# Patient Record
Sex: Female | Born: 1980
Health system: Southern US, Community
[De-identification: ages and names within clinical notes are randomized; demographics above are authoritative.]

## PROBLEM LIST (undated history)

## (undated) DIAGNOSIS — G43909 Migraine, unspecified, not intractable, without status migrainosus: Secondary | ICD-10-CM

## (undated) DIAGNOSIS — J45909 Unspecified asthma, uncomplicated: Secondary | ICD-10-CM

## (undated) DIAGNOSIS — L509 Urticaria, unspecified: Secondary | ICD-10-CM

## (undated) DIAGNOSIS — N979 Female infertility, unspecified: Secondary | ICD-10-CM

## (undated) DIAGNOSIS — L309 Dermatitis, unspecified: Secondary | ICD-10-CM

## (undated) HISTORY — DX: Migraine, unspecified, not intractable, without status migrainosus: G43.909

## (undated) HISTORY — DX: Unspecified asthma, uncomplicated: J45.909

## (undated) HISTORY — DX: Female infertility, unspecified: N97.9

## (undated) HISTORY — DX: Dermatitis, unspecified: L30.9

## (undated) HISTORY — DX: Urticaria, unspecified: L50.9

---

## 2018-11-13 DIAGNOSIS — L570 Actinic keratosis: Secondary | ICD-10-CM | POA: Diagnosis not present

## 2018-11-13 DIAGNOSIS — D2239 Melanocytic nevi of other parts of face: Secondary | ICD-10-CM | POA: Diagnosis not present

## 2019-05-24 ENCOUNTER — Other Ambulatory Visit: Payer: Self-pay

## 2019-05-25 ENCOUNTER — Encounter: Payer: Self-pay | Admitting: Family Medicine

## 2019-05-25 ENCOUNTER — Ambulatory Visit (INDEPENDENT_AMBULATORY_CARE_PROVIDER_SITE_OTHER): Payer: 59 | Admitting: Family Medicine

## 2019-05-25 VITALS — BP 102/68 | HR 78 | Temp 97.1°F | Ht 65.0 in | Wt 128.0 lb

## 2019-05-25 DIAGNOSIS — J4540 Moderate persistent asthma, uncomplicated: Secondary | ICD-10-CM

## 2019-05-25 DIAGNOSIS — J4530 Mild persistent asthma, uncomplicated: Secondary | ICD-10-CM | POA: Insufficient documentation

## 2019-05-25 DIAGNOSIS — Z0000 Encounter for general adult medical examination without abnormal findings: Secondary | ICD-10-CM

## 2019-05-25 MED ORDER — ALBUTEROL SULFATE HFA 108 (90 BASE) MCG/ACT IN AERS: 1.0000 | INHALATION_SPRAY | Freq: Four times a day (QID) | RESPIRATORY_TRACT | 1 refills | Status: DC | PRN

## 2019-05-25 NOTE — Progress Notes (Addendum)
Established Patient Office Visit  Subjective:  Patient ID: Holly Lane, female    DOB: 14-Oct-1980  Age: 39 y.o. MRN: [id number]  CC:  Chief Complaint  Patient presents with  . Establish Care    New pt, asthma evaluation need refills.     HPI Holly Lane presents for establishment of care and follow-up of her asthma.  Longstanding history of asthma since she was 39 years old.  She is a dedicated distance runner.  She is training most days of the week.  She uses her albuterol inhaler before each run.  She has been tried on some various controllers about 10 years ago but did not tolerate them well.  She is also has a history of allergy rhinitis worse in the spring.  She has not tolerated Zyrtec due to sedation.  She has taken Singulair and denies personality changes with it.  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  She is married.  Has not been able to access dental care this year.  Requests referral for GYN care.  History of periodic swelling in her left lower leg.  She has been diagnosed with incompetent venous valves.  Seems to be worse in the humidity.  Exercise seems to help it.  She occasionally wears support stockings.  Past Medical History:  Diagnosis Date  . Asthma   . Eczema   . Infertility, female   . Migraine   . Urticaria     History reviewed. No pertinent surgical history.  Family History  Problem Relation Age of Onset  . Healthy Mother   . Allergic rhinitis Mother   . Eczema Mother   . Healthy Father   . Allergic rhinitis Father   . Migraines Sister   . Asthma Brother   . Angioedema Neg Hx   . Immunodeficiency Neg Hx   . Urticaria Neg Hx     Social History   Socioeconomic History  . Marital status: Married    Spouse name: Dorothea Ogle  . Number of children: 0  . Years of education: Masters  . Highest education level: Master's degree (e.g., MA, MS, MEng, MEd, MSW, MBA)  Occupational History  .  Not on file  Tobacco Use  . Smoking status: Never Smoker  . Smokeless tobacco: Never Used  Vaping Use  . Vaping Use: Never used  Substance and Sexual Activity  . Alcohol use: Yes    Comment: social  . Drug use: Never  . Sexual activity: Yes  Other Topics Concern  . Not on file  Social History Narrative  . Not on file   Social Determinants of Health   Financial Resource Strain:   . Difficulty of Paying Living Expenses:   Food Insecurity:   . Worried About Charity fundraiser in the Last Year:   . Arboriculturist in the Last Year:   Transportation Needs:   . Film/video editor (Medical):   Marland Kitchen Lack of Transportation (Non-Medical):   Physical Activity:   . Days of Exercise per Week:   . Minutes of Exercise per Session:   Stress:   . Feeling of Stress :   Social Connections:   . Frequency of Communication with Friends and Family:   . Frequency of Social Gatherings with Friends and Family:   . Attends Religious Services:   . Active Member of Clubs or Organizations:   . Attends Archivist Meetings:   .  Marital Status:   Intimate Partner Violence:   . Fear of Current or Ex-Partner:   . Emotionally Abused:   Marland Kitchen Physically Abused:   . Sexually Abused:     Outpatient Medications Prior to Visit  Medication Sig Dispense Refill  . albuterol (VENTOLIN HFA) 108 (90 Base) MCG/ACT inhaler Inhale 1 puff into the lungs daily.    . montelukast (SINGULAIR) 10 MG tablet Take 10 mg by mouth daily.     No facility-administered medications prior to visit.    Allergies  Allergen Reactions  . Penicillins     ROS Review of Systems  Constitutional: Negative.   HENT: Negative.   Eyes: Negative.   Respiratory: Negative for chest tightness, shortness of breath and wheezing.   Cardiovascular: Positive for leg swelling. Negative for chest pain and palpitations.  Gastrointestinal: Negative.   Endocrine: Negative for polyphagia and polyuria.  Genitourinary: Negative.     Musculoskeletal: Negative.   Allergic/Immunologic: Negative for immunocompromised state.  Neurological: Negative.   Hematological: Does not bruise/bleed easily.  Psychiatric/Behavioral: Negative for dysphoric mood. The patient is not nervous/anxious.       Objective:    Physical Exam  Constitutional: She is oriented to person, place, and time. She appears well-developed and well-nourished. No distress.  HENT:  Head: Normocephalic and atraumatic.  Right Ear: External ear normal.  Left Ear: External ear normal.  Eyes: Conjunctivae are normal. Right eye exhibits no discharge. Left eye exhibits no discharge. No scleral icterus.  Neck: No JVD present. No tracheal deviation present.  Cardiovascular: Normal rate, regular rhythm and normal heart sounds.  Pulmonary/Chest: Effort normal and breath sounds normal. No stridor.  Neurological: She is alert and oriented to person, place, and time.  Skin: Skin is warm and dry. She is not diaphoretic.  Psychiatric: She has a normal mood and affect. Her behavior is normal.    BP 102/68   Pulse 78   Temp (!) 97.1 F (36.2 C) (Tympanic)   Ht 5' 5"  (1.651 m)   Wt 128 lb (58.1 kg)   SpO2 99%   BMI 21.30 kg/m  Wt Readings from Last 3 Encounters:  09/06/19 129 lb (58.5 kg)  06/21/19 131 lb 2.8 oz (59.5 kg)  05/25/19 128 lb (58.1 kg)     Health Maintenance Due  Topic Date Due  . Hepatitis C Screening  Never done  . HIV Screening  Never done    There are no preventive care reminders to display for this patient.  No results found for: TSH No results found for: WBC, HGB, HCT, MCV, PLT No results found for: NA, K, CHLORIDE, CO2, GLUCOSE, BUN, CREATININE, BILITOT, ALKPHOS, AST, ALT, PROT, ALBUMIN, CALCIUM, ANIONGAP, EGFR, GFR No results found for: CHOL No results found for: HDL No results found for: LDLCALC No results found for: TRIG No results found for: CHOLHDL No results found for: HGBA1C    Assessment & Plan:   Problem List Items  Addressed This Visit      Respiratory   Mild persistent asthma - Primary    Other Visit Diagnoses    Healthcare maintenance       Relevant Orders   Ambulatory referral to Obstetrics / Gynecology   CBC   Comprehensive metabolic panel   HIV Antibody (routine testing w rflx)   Hepatitis C antibody   Lipid panel   Urinalysis, Routine w reflex microscopic   Urine cytology ancillary only      Meds ordered this encounter  Medications  . DISCONTD:  albuterol (VENTOLIN HFA) 108 (90 Base) MCG/ACT inhaler    Sig: Inhale 1 puff into the lungs every 6 (six) hours as needed for wheezing or shortness of breath.    Dispense:  18 g    Refill:  1    Follow-up: Return in about 1 year (around 05/24/2020), or if symptoms worsen or fail to improve.    Libby Maw, MD

## 2019-06-07 ENCOUNTER — Ambulatory Visit: Payer: 59 | Admitting: Allergy and Immunology

## 2019-06-21 ENCOUNTER — Encounter: Payer: Self-pay | Admitting: Allergy and Immunology

## 2019-06-21 ENCOUNTER — Ambulatory Visit (INDEPENDENT_AMBULATORY_CARE_PROVIDER_SITE_OTHER): Payer: 59 | Admitting: Allergy and Immunology

## 2019-06-21 ENCOUNTER — Other Ambulatory Visit: Payer: Self-pay

## 2019-06-21 VITALS — BP 96/56 | HR 73 | Temp 98.0°F | Resp 12 | Ht 66.0 in | Wt 131.2 lb

## 2019-06-21 DIAGNOSIS — J4540 Moderate persistent asthma, uncomplicated: Secondary | ICD-10-CM | POA: Diagnosis not present

## 2019-06-21 DIAGNOSIS — J4530 Mild persistent asthma, uncomplicated: Secondary | ICD-10-CM | POA: Diagnosis not present

## 2019-06-21 DIAGNOSIS — J3089 Other allergic rhinitis: Secondary | ICD-10-CM | POA: Diagnosis not present

## 2019-06-21 MED ORDER — ALBUTEROL SULFATE HFA 108 (90 BASE) MCG/ACT IN AERS: 1.0000 | INHALATION_SPRAY | Freq: Four times a day (QID) | RESPIRATORY_TRACT | 1 refills | Status: DC | PRN

## 2019-06-21 MED ORDER — MONTELUKAST SODIUM 10 MG PO TABS: 10.0000 mg | ORAL_TABLET | Freq: Every day | ORAL | 5 refills | Status: DC

## 2019-06-21 NOTE — Assessment & Plan Note (Signed)
   A prescription has been provided for montelukast 10 mg daily at bedtime.  Potential montelukast side effects have been discussed.  Continue albuterol HFA, 1 to 2 inhalations every 4-6 hours if needed and 15 minutes prior to exercise.  Subjective and objective measures of pulmonary function will be followed and the treatment plan will be adjusted accordingly.

## 2019-06-21 NOTE — Assessment & Plan Note (Signed)
   We were unable to perform skin tests today due to the patient's time constraints today.  Montelukast has been prescribed (as above).  The patient is scheduled to return in the near future for allergy skin testing after having been off of antihistamines for at least 3 days.  Further recommendations will be made at that time based upon skin test results.

## 2019-06-21 NOTE — Progress Notes (Signed)
New Patient Note  RE: Holly Lane MRN: [id number] DOB: 1980-09-18 Date of Office Visit: 06/21/2019  Referring provider: Libby Maw,* Primary care provider: Libby Maw, MD  Chief Complaint: Asthma   History of present illness: Holly Lane is a 39 y.o. female seen today in consultation requested by Libby Maw, MD.  She has a history of asthma and reports that despite using albuterol prior to exercise she experiences chest tightness and disproportionately increased heart rate while running during the spring and, to a lesser extent, during the fall.  Her baseline heart rate is in the upper 50s/lower 60s, however when running with pollen exposure her heart rate gets up in the 180s.  Therefore, she decreases her exercise intensity to maintain a lower heart rate.  While at rest she experiences asthma symptoms almost daily, however will only reach for her albuterol rescue inhaler 1 time per week on average.  She currently is not on an asthma controller medication.  She has tried Pulmicort, Flovent, Symbicort, and Advair in the past, however reports that she discontinued these medications because of side effects such as palpitations, weight gain, and increased hemoglobin A1c while on these medications.  She had been on montelukast but discontinued in 2018 as she was starting fertility treatment. Leisel experiences nasal congestion, rhinorrhea, sneezing, and postnasal drainage.  The symptoms are most frequent and severe during the springtime and in the fall.  Assessment and plan: Mild persistent asthma  A prescription has been provided for montelukast 10 mg daily at bedtime.  Potential montelukast side effects have been discussed.  Continue albuterol HFA, 1 to 2 inhalations every 4-6 hours if needed and 15 minutes prior to exercise.  Subjective and objective measures of pulmonary function will be followed and the treatment plan will be adjusted  accordingly.  Other allergic rhinitis  We were unable to perform skin tests today due to the patient's time constraints today.  Montelukast has been prescribed (as above).  The patient is scheduled to return in the near future for allergy skin testing after having been off of antihistamines for at least 3 days.  Further recommendations will be made at that time based upon skin test results.   Meds ordered this encounter  Medications  . montelukast (SINGULAIR) 10 MG tablet    Sig: Take 1 tablet (10 mg total) by mouth at bedtime.    Dispense:  30 tablet    Refill:  5  . albuterol (VENTOLIN HFA) 108 (90 Base) MCG/ACT inhaler    Sig: Inhale 1 puff into the lungs every 6 (six) hours as needed for wheezing or shortness of breath.    Dispense:  18 g    Refill:  1    Diagnostics: Spirometry: FVC was 4.06 L and FEV1 was 3.05 L (94% predicted) with 90 mL postbronchodilator improvement.  This study was performed while the patient was asymptomatic.  Please see scanned spirometry results for details. Allergy skin testing: Skin testing was deferred today because of the patient's time constraints.   Physical examination: Blood pressure (!) 96/56, pulse 73, temperature 98 F (36.7 C), temperature source Oral, resp. rate 12, height 5\' 6"  (1.676 m), weight 131 lb 2.8 oz (59.5 kg), SpO2 99 %.  General: Alert, interactive, in no acute distress. HEENT: TMs pearly gray, turbinates mildly edematous without discharge, post-pharynx moderately erythematous. Neck: Supple without lymphadenopathy. Lungs: Clear to auscultation without wheezing, rhonchi or rales. CV: Normal S1, S2 without murmurs. Abdomen: Nondistended, nontender. Skin: Warm  and dry, without lesions or rashes. Extremities:  No clubbing, cyanosis or edema. Neuro:   Grossly intact.  Review of systems:  Review of systems negative except as noted in HPI / PMHx or noted below: Review of Systems  Constitutional: Negative.   HENT: Negative.    Eyes: Negative.   Respiratory: Negative.   Cardiovascular: Negative.   Gastrointestinal: Negative.   Genitourinary: Negative.   Musculoskeletal: Negative.   Skin: Negative.   Neurological: Negative.   Endo/Heme/Allergies: Negative.   Psychiatric/Behavioral: Negative.     Past medical history:  Past Medical History:  Diagnosis Date  . Asthma   . Eczema   . Infertility, female   . Migraine   . Urticaria     Past surgical history:  History reviewed. No pertinent surgical history.  Family history: Family History  Problem Relation Age of Onset  . Healthy Mother   . Allergic rhinitis Mother   . Eczema Mother   . Healthy Father   . Allergic rhinitis Father   . Migraines Sister   . Asthma Brother   . Angioedema Neg Hx   . Immunodeficiency Neg Hx   . Urticaria Neg Hx     Social history: Social History   Socioeconomic History  . Marital status: Married    Spouse name: Not on file  . Number of children: Not on file  . Years of education: Not on file  . Highest education level: Not on file  Occupational History  . Not on file  Tobacco Use  . Smoking status: Never Smoker  . Smokeless tobacco: Never Used  Substance and Sexual Activity  . Alcohol use: Yes    Comment: social  . Drug use: Never  . Sexual activity: Yes  Other Topics Concern  . Not on file  Social History Narrative  . Not on file   Social Determinants of Health   Financial Resource Strain:   . Difficulty of Paying Living Expenses:   Food Insecurity:   . Worried About Charity fundraiser in the Last Year:   . Arboriculturist in the Last Year:   Transportation Needs:   . Film/video editor (Medical):   Marland Kitchen Lack of Transportation (Non-Medical):   Physical Activity:   . Days of Exercise per Week:   . Minutes of Exercise per Session:   Stress:   . Feeling of Stress :   Social Connections:   . Frequency of Communication with Friends and Family:   . Frequency of Social Gatherings with  Friends and Family:   . Attends Religious Services:   . Active Member of Clubs or Organizations:   . Attends Archivist Meetings:   Marland Kitchen Marital Status:   Intimate Partner Violence:   . Fear of Current or Ex-Partner:   . Emotionally Abused:   Marland Kitchen Physically Abused:   . Sexually Abused:     Environmental History: The patient lives in a 40 year old house with carpeting throughout and central air/heat.  There are 2 dogs in the home.  There is no known mold/water damage in the home.  She is a non-smoker.   Current Outpatient Medications  Medication Sig Dispense Refill  . albuterol (VENTOLIN HFA) 108 (90 Base) MCG/ACT inhaler Inhale 1 puff into the lungs every 6 (six) hours as needed for wheezing or shortness of breath. 18 g 1  . Calcium-Magnesium-Vitamin D (CALCIUM 1200+D3 PO) Take by mouth.    . Magnesium Oxide 200 MG TABS Take 2 tablets by mouth  daily.    . Prenatal Multivit-Min-Fe-FA (PRENATAL/IRON PO) Take by mouth.    . montelukast (SINGULAIR) 10 MG tablet Take 10 mg by mouth daily.    . montelukast (SINGULAIR) 10 MG tablet Take 1 tablet (10 mg total) by mouth at bedtime. 30 tablet 5   No current facility-administered medications for this visit.    Known medication allergies: Allergies  Allergen Reactions  . Penicillins     I appreciate the opportunity to take part in Muskan's care. Please do not hesitate to contact me with questions.  Sincerely,   R. Edgar Frisk, MD

## 2019-06-21 NOTE — Patient Instructions (Addendum)
Mild persistent asthma  A prescription has been provided for montelukast 10 mg daily at bedtime.  Potential montelukast side effects have been discussed.  Continue albuterol HFA, 1 to 2 inhalations every 4-6 hours if needed and 15 minutes prior to exercise.  Subjective and objective measures of pulmonary function will be followed and the treatment plan will be adjusted accordingly.  Other allergic rhinitis  We were unable to perform skin tests today due to the patient's time constraints today.  Montelukast has been prescribed (as above).  The patient is scheduled to return in the near future for allergy skin testing after having been off of antihistamines for at least 3 days.  Further recommendations will be made at that time based upon skin test results.   Return for allergy skin testing.

## 2019-06-22 ENCOUNTER — Encounter: Payer: Self-pay | Admitting: Allergy and Immunology

## 2019-07-26 ENCOUNTER — Other Ambulatory Visit: Payer: Self-pay

## 2019-07-26 ENCOUNTER — Ambulatory Visit (INDEPENDENT_AMBULATORY_CARE_PROVIDER_SITE_OTHER): Payer: 59 | Admitting: Pediatrics

## 2019-07-26 ENCOUNTER — Encounter: Payer: Self-pay | Admitting: Pediatrics

## 2019-07-26 VITALS — BP 90/72 | HR 62 | Temp 98.0°F | Resp 16

## 2019-07-26 DIAGNOSIS — J301 Allergic rhinitis due to pollen: Secondary | ICD-10-CM

## 2019-07-26 DIAGNOSIS — J4530 Mild persistent asthma, uncomplicated: Secondary | ICD-10-CM

## 2019-07-26 NOTE — Patient Instructions (Addendum)
Environmental control of dust mite Claritin 10 mg or instead  Allegra 180 mg--1 tablet once a day if needed for runny nose or itchy eyes Fluticasone 2 sprays per nostril once a day if needed for stuffy nose  Montelukast 10 mg-take 1 tablet once a day to prevent coughing or wheezing Ventolin 2 puffs every 4 hours if needed for wheezing coughing spells.  You may use Ventolin 2 puffs 5 to 15 minutes before exercise  Continue on your other medications Call us if you are not doing well on this treatment plan

## 2019-07-26 NOTE — Progress Notes (Signed)
  [place] [address] Dept: [phone]  FOLLOW UP NOTE  Patient ID: Holly Lane, female    DOB: 07/17/1980  Age: 39 y.o. MRN: [id number] Date of Office Visit: 07/26/2019  Assessment  Chief Complaint: Allergy Testing  HPI Holly Lane presents for allergy testing.  We could not test her at the last visit because she had a meeting that could not wait.  She has been having seasonal allergic rhinitis.  She has aggravation of her symptoms in the springtime and on exposure to cat.  She has not filled out a prescription for montelukast to see if it helps with her exercise-induced bronchospasm.  She has a Ventolin inhaler that she uses 2 puffs before exercise   Drug Allergies:  Allergies  Allergen Reactions  . Penicillins     Physical Exam: BP 90/72 (BP Location: Left Arm, Patient Position: Sitting, Cuff Size: Normal)   Pulse 62   Temp 98 F (36.7 C) (Oral)   Resp 16   SpO2 100%    Physical Exam Vitals reviewed.  Constitutional:      Appearance: Normal appearance. She is normal weight.  HENT:     Head:     Comments: Eyes normal.  Ears normal.  Nose normal.  Pharynx normal. Cardiovascular:     Rate and Rhythm: Normal rate and regular rhythm.     Comments: S1-S2 normal no murmurs Pulmonary:     Comments: Clear to percussion and auscultation Musculoskeletal:     Cervical back: Neck supple.  Lymphadenopathy:     Cervical: No cervical adenopathy.  Neurological:     General: No focal deficit present.     Mental Status: She is alert and oriented to person, place, and time. Mental status is at baseline.  Psychiatric:        Mood and Affect: Mood normal.        Behavior: Behavior normal.        Thought Content: Thought content normal.        Judgment: Judgment normal.     Diagnostics: Allergy skin tests were positive to grass pollens, weed pollen, tree pollens, dust mite and cat  Assessment and Plan: 1. Mild persistent asthma without complication     2. Seasonal allergic rhinitis due to pollen     No orders of the defined types were placed in this encounter.   Patient Instructions  Environmental control of dust mite Claritin 10 mg or instead  Allegra 180 mg--1 tablet once a day if needed for runny nose or itchy eyes Fluticasone 2 sprays per nostril once a day if needed for stuffy nose  Montelukast 10 mg-take 1 tablet once a day to prevent coughing or wheezing Ventolin 2 puffs every 4 hours if needed for wheezing coughing spells.  You may use Ventolin 2 puffs 5 to 15 minutes before exercise  Continue on your other medications Call us if you are not doing well on this treatment plan         Return in about 4 weeks (around 08/23/2019).    Thank you for the opportunity to care for this patient.  Please do not hesitate to contact me with questions.  Penne Lash, M.D.  [facility] [address] [place], [place] [address] [phone]

## 2019-08-03 ENCOUNTER — Encounter: Payer: 59 | Admitting: Obstetrics & Gynecology

## 2019-08-28 MED FILL — ALBUTEROL SULFATE HFA 108 (: 108 (90 BAS | 25 days supply | Qty: 18 | Fill #0

## 2019-09-06 ENCOUNTER — Encounter: Payer: Self-pay | Admitting: Obstetrics & Gynecology

## 2019-09-06 ENCOUNTER — Other Ambulatory Visit (HOSPITAL_COMMUNITY)
Admission: RE | Admit: 2019-09-06 | Discharge: 2019-09-06 | Disposition: A | Discharge status: Home / Self Care | Payer: 59 | Source: Ambulatory Visit | Attending: Obstetrics & Gynecology | Admitting: Obstetrics & Gynecology

## 2019-09-06 ENCOUNTER — Other Ambulatory Visit: Payer: Self-pay

## 2019-09-06 ENCOUNTER — Ambulatory Visit (INDEPENDENT_AMBULATORY_CARE_PROVIDER_SITE_OTHER): Payer: 59 | Admitting: Obstetrics & Gynecology

## 2019-09-06 VITALS — BP 102/66 | HR 61 | Ht 66.0 in | Wt 129.0 lb

## 2019-09-06 DIAGNOSIS — Z1272 Encounter for screening for malignant neoplasm of vagina: Secondary | ICD-10-CM

## 2019-09-06 DIAGNOSIS — Z1239 Encounter for other screening for malignant neoplasm of breast: Secondary | ICD-10-CM | POA: Diagnosis not present

## 2019-09-06 DIAGNOSIS — N946 Dysmenorrhea, unspecified: Secondary | ICD-10-CM | POA: Diagnosis not present

## 2019-09-06 DIAGNOSIS — Z01419 Encounter for gynecological examination (general) (routine) without abnormal findings: Secondary | ICD-10-CM | POA: Insufficient documentation

## 2019-09-06 MED ORDER — DICLOFENAC POTASSIUM 50 MG PO TABS: 50.0000 mg | ORAL_TABLET | Freq: Three times a day (TID) | ORAL | 2 refills | Status: AC

## 2019-09-06 NOTE — Patient Instructions (Addendum)
Dysmenorrhea  Dysmenorrhea refers to cramps caused by the muscles of the uterus tightening (contracting) during a menstrual period. Dysmenorrhea may be mild, or it may be severe enough to interfere with everyday activities for a few days each month. Primary dysmenorrhea is menstrual cramps that last a couple of days when you start having menstrual periods or soon after. This often begins after a teenager starts having her period. As a woman gets older or has a baby, the cramps will usually lessen or disappear. Secondary dysmenorrhea begins later in life and is caused by a disorder in the reproductive system. It lasts longer, and it may cause more pain than primary dysmenorrhea. The pain may start before the period and last a few days after the period. What are the causes? Dysmenorrhea is usually caused by an underlying problem, such as:  The tissue that lines the uterus (endometrium) growing outside of the uterus in other areas of the body (endometriosis).  Endometrial tissue growing into the muscular walls of the uterus (adenomyosis).  Blood vessels in the pelvis becoming filled with blood just before the menstrual period (pelvic congestive syndrome).  Overgrowth of cells (polyps) in the endometrium or the lower part of the uterus (cervix).  The uterus dropping down into the vagina (prolapse) due to stretched or weak muscles.  Bladder problems, such as infection or inflammation.  Intestinal problems, such as a tumor or irritable bowel syndrome.  Cancer of the reproductive organs or bladder.  A severely tipped uterus.  A cervix that is closed or has a very small opening.  Noncancerous (benign) tumors of the uterus (fibroids).  Pelvic inflammatory disease (PID).  Pelvic scarring (adhesions) from a previous surgery.  An ovarian cyst.  An IUD (intrauterine device). What increases the risk? You are more likely to develop this condition if:  You are younger than age 67.  You  started puberty early.  You have irregular or heavy bleeding.  You have never given birth.  You have a family history of dysmenorrhea.  You smoke. What are the signs or symptoms? Symptoms of this condition include:  Cramping, throbbing pain, or a feeling of fullness in the lower abdomen.  Lower back pain.  Periods lasting for longer than 7 days.  Headaches.  Bloating.  Fatigue.  Nausea or vomiting.  Diarrhea.  Sweating or dizziness.  Loose stools. How is this diagnosed? This condition may be diagnosed based on:  Your symptoms.  Your medical history.  A physical exam.  Blood tests.  A Pap test. This is a test in which cells from the cervix are tested for signs of cancer or infection.  A pregnancy test.  Imaging tests, such as: ? Ultrasound. ? A procedure to remove and examine a sample of endometrial tissue (dilation and curettage, D&C). ? A procedure to visually examine the inside of:  The uterus (hysteroscopy).  The abdomen or pelvis (laparoscopy).  The bladder (cystoscopy).  The intestine (colonoscopy).  The stomach (gastroscopy). ? X-rays. ? CT scan. ? MRI. How is this treated? Treatment depends on the cause of the dysmenorrhea. Treatment may include:  Pain medicine prescribed by your health care provider.  Birth control pills that contain the hormone progesterone.  An IUD that contains the hormone progesterone.  Medicines to control bleeding.  Hormone replacement therapy.  NSAIDs. These may help to stop the production of hormones that cause cramps.  Antidepressant medicines.  Surgery to remove adhesions, endometriosis, ovarian cysts, fibroids, or the entire uterus (hysterectomy).  Injections of progesterone  to stop the menstrual period.  A procedure to destroy the endometrium (endometrial ablation).  A procedure to cut the nerves in the bottom of the spine (sacrum) that go to the reproductive organs (presacral neurectomy).  A  procedure to apply an electric current to nerves in the sacrum (sacral nerve stimulation).  Exercise and physical therapy.  Meditation and yoga therapy.  Acupuncture. Work with your health care provider to determine what treatment or combination of treatments is best for you. Follow these instructions at home: Relieving pain and cramping  Apply heat to your lower back or abdomen when you experience pain or cramps. Use the heat source that your health care provider recommends, such as a moist heat pack or a heating pad. ? Place a towel between your skin and the heat source. ? Leave the heat on for 20-30 minutes. ? Remove the heat if your skin turns bright red. This is especially important if you are unable to feel pain, heat, or cold. You may have a greater risk of getting burned. ? Do not sleep with a heating pad on.  Do aerobic exercises, such as walking, swimming, or biking. This can help to relieve cramps.  Massage your lower back or abdomen to help relieve pain. General instructions  Take over-the-counter and prescription medicines only as told by your health care provider.  Do not drive or use heavy machinery while taking prescription pain medicine.  Avoid alcohol and caffeine during and right before your menstrual period. These can make cramps worse.  Do not use any products that contain nicotine or tobacco, such as cigarettes and e-cigarettes. If you need help quitting, ask your health care provider.  Keep all follow-up visits as told by your health care provider. This is important. Contact a health care provider if:  You have pain that gets worse or does not get better with medicine.  You have pain with sex.  You develop nausea or vomiting with your period that is not controlled with medicine. Get help right away if:  You faint. Summary  Dysmenorrhea refers to cramps caused by the muscles of the uterus tightening (contracting) during a menstrual  period.  Dysmenorrhea may be mild, or it may be severe enough to interfere with everyday activities for a few days each month.  Treatment depends on the cause of the dysmenorrhea.  Work with your health care provider to determine what treatment or combination of treatments is best for you. This information is not intended to replace advice given to you by your health care provider. Make sure you discuss any questions you have with your health care provider. Document Revised: 03/11/2017 Document Reviewed: 05/01/2016 Elsevier Patient Education  [place]. Levonorgestrel intrauterine device (IUD) What is this medicine? LEVONORGESTREL IUD (LEE voe nor jes trel) is a contraceptive (birth control) device. The device is placed inside the uterus by a healthcare professional. It is used to prevent pregnancy. This device can also be used to treat heavy bleeding that occurs during your period. This medicine may be used for other purposes; ask your health care provider or pharmacist if you have questions. COMMON BRAND NAME(S): Minette Headland What should I tell my health care provider before I take this medicine? They need to know if you have any of these conditions: abnormal Pap smear cancer of the breast, uterus, or cervix diabetes endometritis genital or pelvic infection now or in the past have more than one sexual partner or your partner has more than one  partner heart disease history of an ectopic or tubal pregnancy immune system problems IUD in place liver disease or tumor problems with blood clots or take blood-thinners seizures use intravenous drugs uterus of unusual shape vaginal bleeding that has not been explained an unusual or allergic reaction to levonorgestrel, other hormones, silicone, or polyethylene, medicines, foods, dyes, or preservatives pregnant or trying to get pregnant breast-feeding How should I use this medicine? This device is placed inside  the uterus by a health care professional. Talk to your pediatrician regarding the use of this medicine in children. Special care may be needed. Overdosage: If you think you have taken too much of this medicine contact a poison control center or emergency room at once. NOTE: This medicine is only for you. Do not share this medicine with others. What if I miss a dose? This does not apply. Depending on the brand of device you have inserted, the device will need to be replaced every 3 to 6 years if you wish to continue using this type of birth control. What may interact with this medicine? Do not take this medicine with any of the following medications: amprenavir bosentan fosamprenavir This medicine may also interact with the following medications: aprepitant armodafinil barbiturate medicines for inducing sleep or treating seizures bexarotene boceprevir griseofulvin medicines to treat seizures like carbamazepine, ethotoin, felbamate, oxcarbazepine, phenytoin, topiramate modafinil pioglitazone rifabutin rifampin rifapentine some medicines to treat HIV infection like atazanavir, efavirenz, indinavir, lopinavir, nelfinavir, tipranavir, ritonavir St. John's wort warfarin This list may not describe all possible interactions. Give your health care provider a list of all the medicines, herbs, non-prescription drugs, or dietary supplements you use. Also tell them if you smoke, drink alcohol, or use illegal drugs. Some items may interact with your medicine. What should I watch for while using this medicine? Visit your doctor or health care professional for regular check ups. See your doctor if you or your partner has sexual contact with others, becomes HIV positive, or gets a sexual transmitted disease. This product does not protect you against HIV infection (AIDS) or other sexually transmitted diseases. You can check the placement of the IUD yourself by reaching up to the top of your vagina with  clean fingers to feel the threads. Do not pull on the threads. It is a good habit to check placement after each menstrual period. Call your doctor right away if you feel more of the IUD than just the threads or if you cannot feel the threads at all. The IUD may come out by itself. You may become pregnant if the device comes out. If you notice that the IUD has come out use a backup birth control method like condoms and call your health care provider. Using tampons will not change the position of the IUD and are okay to use during your period. This IUD can be safely scanned with magnetic resonance imaging (MRI) only under specific conditions. Before you have an MRI, tell your healthcare provider that you have an IUD in place, and which type of IUD you have in place. What side effects may I notice from receiving this medicine? Side effects that you should report to your doctor or health care professional as soon as possible: allergic reactions like skin rash, itching or hives, swelling of the face, lips, or tongue fever, flu-like symptoms genital sores high blood pressure no menstrual period for 6 weeks during use pain, swelling, warmth in the leg pelvic pain or tenderness severe or sudden headache signs of pregnancy  stomach cramping sudden shortness of breath trouble with balance, talking, or walking unusual vaginal bleeding, discharge yellowing of the eyes or skin Side effects that usually do not require medical attention (report to your doctor or health care professional if they continue or are bothersome): acne breast pain change in sex drive or performance changes in weight cramping, dizziness, or faintness while the device is being inserted headache irregular menstrual bleeding within first 3 to 6 months of use nausea This list may not describe all possible side effects. Call your doctor for medical advice about side effects. You may report side effects to FDA at  1-800-FDA-1088. Where should I keep my medicine? This does not apply. NOTE: This sheet is a summary. It may not cover all possible information. If you have questions about this medicine, talk to your doctor, pharmacist, or health care provider.  2020 Elsevier/Gold Standard (2018-02-07 13:22:01)

## 2019-09-06 NOTE — Progress Notes (Signed)
Has noticed increase in cramping and PMS with periods over the last three years.  Patient states she notice big changes after fertility treatments (2017- 10 months of IUI, clom[facility]).

## 2019-09-06 NOTE — Progress Notes (Signed)
Subjective:     Holly Lane is a 39 y.o. female here for a routine exam. G0 Current complaints: increased cramping with cycles for the past 2-3 years. Pt and spouse went through a full infertility workup but, she had an issue with one of the meds which led to 45 min of visual changes and they did not proceed with IVF. Her dx was unexplained infertility. Pt recently moved here from [facility]. 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 Pt runs ultra marathons.    Gynecologic History Patient's last menstrual period was 08/19/2019 (exact date). Contraception: none Last Pap: 2017. Results were: normal Last mammogram: never had  Obstetric History OB History  Gravida Para Term Preterm AB Living  0 0 0 0 0 0  SAB TAB Ectopic Multiple Live Births  0 0 0 0 0   The following portions of the patient's history were reviewed and updated as appropriate: allergies, current medications, past family history, past medical history, past social history, past surgical history and problem list.  Review of Systems Pertinent items are noted in HPI.    Objective:  BP 102/66   Pulse 61   Ht 5\' 6"  (1.676 m)   Wt 129 lb (58.5 kg)   LMP 08/19/2019 (Exact Date)   BMI 20.82 kg/m      General Appearance:    Alert, cooperative, no distress, appears stated age  Head:    Normocephalic, without obvious abnormality, atraumatic  Eyes:    conjunctiva/corneas clear, EOM's intact, both eyes  Ears:    Normal external ear canals, both ears  Nose:   Nares normal, septum midline, mucosa normal, no drainage    or sinus tenderness  Throat:   Lips, mucosa, and tongue normal; teeth and gums normal  Neck:   Supple, symmetrical, trachea midline, no adenopathy;    thyroid:  no enlargement/tenderness/nodules  Back:     Symmetric, no curvature, ROM normal, no CVA tenderness  Lungs:     respirations unlabored  Chest Wall:    No tenderness or deformity   Heart:    Regular rate and rhythm  Breast Exam:    No  tenderness, masses, or nipple abnormality  Abdomen:     Soft, non-tender, bowel sounds active all four quadrants,    no masses, no organomegaly  Genitalia:    Normal female without lesion, discharge or tenderness   uterus small and mobile.   Extremities:   Extremities normal, atraumatic, no cyanosis or edema  Pulses:   2+ and symmetric all extremities  Skin:   Skin color, texture, turgor normal, no rashes or lesions     Assessment:    Healthy female exam.   2nd dysmenorrhea- will change from Motrin to Cataflam. D/w pt LnIUD. She will review and decide if this is an option for her  Breast cancer screen Mood changes- pt will keep a menstrual calendar to fully eval    Plan:   F/u in 3 months for eval of menstrual calendar F/u 1 yea r annual  Diagnoses and all orders for this visit:  Well female exam with routine gynecological exam -     Cytology - PAP( [place])  Dysmenorrhea -     MM Digital Screening; Future  Breast cancer screening other than mammogram -     diclofenac (CATAFLAM) 50 MG tablet; Take 1 tablet (50 mg total) by mouth 3 (three) times daily.  Jaymon Dudek L. Harraway-Mathurin, M.D., Holly Lane

## 2019-09-07 LAB — CYTOLOGY - PAP
Comment: NEGATIVE
Diagnosis: NEGATIVE
High risk HPV: NEGATIVE

## 2019-09-25 ENCOUNTER — Other Ambulatory Visit: Payer: 59

## 2019-09-27 ENCOUNTER — Other Ambulatory Visit: Payer: Self-pay

## 2019-09-28 ENCOUNTER — Other Ambulatory Visit (INDEPENDENT_AMBULATORY_CARE_PROVIDER_SITE_OTHER): Payer: 59

## 2019-09-28 ENCOUNTER — Other Ambulatory Visit (HOSPITAL_COMMUNITY)
Admission: RE | Admit: 2019-09-28 | Discharge: 2019-09-28 | Disposition: A | Discharge status: Home / Self Care | Payer: 59 | Source: Ambulatory Visit | Attending: Family Medicine | Admitting: Family Medicine

## 2019-09-28 DIAGNOSIS — Z0000 Encounter for general adult medical examination without abnormal findings: Secondary | ICD-10-CM | POA: Insufficient documentation

## 2019-09-28 LAB — COMPREHENSIVE METABOLIC PANEL
ALT: 19 U/L (ref 0–35)
AST: 22 U/L (ref 0–37)
Albumin: 4.4 g/dL (ref 3.5–5.2)
Alkaline Phosphatase: 47 U/L (ref 39–117)
BUN: 11 mg/dL (ref 6–23)
CO2: 27 mEq/L (ref 19–32)
Calcium: 9.1 mg/dL (ref 8.4–10.5)
Chloride: 103 mEq/L (ref 96–112)
Creatinine, Ser: 0.84 mg/dL (ref 0.40–1.20)
GFR: 75.07 mL/min (ref >60.00)
Glucose, Bld: 85 mg/dL (ref 70–99)
Potassium: 3.9 mEq/L (ref 3.5–5.1)
Sodium: 134 mEq/L — ABNORMAL LOW (ref 135–145)
Total Bilirubin: 0.6 mg/dL (ref 0.2–1.2)
Total Protein: 6.6 g/dL (ref 6.0–8.3)

## 2019-09-28 LAB — URINALYSIS, ROUTINE W REFLEX MICROSCOPIC
Bilirubin Urine: NEGATIVE
Hgb urine dipstick: NEGATIVE
Ketones, ur: NEGATIVE
Leukocytes,Ua: NEGATIVE
Nitrite: NEGATIVE
RBC / HPF: NONE SEEN (ref 0–?)
Specific Gravity, Urine: 1.01 (ref 1.000–1.030)
Total Protein, Urine: NEGATIVE
Urine Glucose: NEGATIVE
Urobilinogen, UA: 0.2 (ref 0.0–1.0)
WBC, UA: NONE SEEN (ref 0–?)
pH: 7 (ref 5.0–8.0)

## 2019-09-28 LAB — CBC
HCT: 39 % (ref 36.0–46.0)
Hemoglobin: 13.1 g/dL (ref 12.0–15.0)
MCHC: 33.5 g/dL (ref 30.0–36.0)
MCV: 98.1 fl (ref 78.0–100.0)
Platelets: 234 10*3/uL (ref 150.0–400.0)
RBC: 3.97 Mil/uL (ref 3.87–5.11)
RDW: 13.1 % (ref 11.5–15.5)
WBC: 5.8 10*3/uL (ref 4.0–10.5)

## 2019-09-28 LAB — LIPID PANEL
Cholesterol: 142 mg/dL (ref 0–200)
HDL: 77.5 mg/dL (ref >39.00)
LDL Cholesterol: 56 mg/dL (ref 0–99)
NonHDL: 64.82
Total CHOL/HDL Ratio: 2
Triglycerides: 44 mg/dL (ref 0.0–149.0)
VLDL: 8.8 mg/dL (ref 0.0–40.0)

## 2019-09-28 NOTE — Addendum Note (Signed)
Addended by: Jon Billings on: 09/28/2019 07:44 AM   Modules accepted: Orders

## 2019-09-28 NOTE — Addendum Note (Signed)
Addended by: Lynnea Ferrier on: 09/28/2019 08:43 AM   Modules accepted: Orders

## 2019-10-01 LAB — URINE CYTOLOGY ANCILLARY ONLY
Chlamydia: NEGATIVE
Comment: NEGATIVE

## 2019-10-01 LAB — HEPATITIS C ANTIBODY
Hepatitis C Ab: NONREACTIVE
SIGNAL TO CUT-OFF: 0.01 (ref <1.00)

## 2019-10-01 LAB — HIV ANTIBODY (ROUTINE TESTING W REFLEX): HIV 1&2 Ab, 4th Generation: NONREACTIVE

## 2019-11-19 DIAGNOSIS — D2361 Other benign neoplasm of skin of right upper limb, including shoulder: Secondary | ICD-10-CM | POA: Diagnosis not present

## 2019-11-19 DIAGNOSIS — L905 Scar conditions and fibrosis of skin: Secondary | ICD-10-CM | POA: Diagnosis not present

## 2019-11-19 DIAGNOSIS — D485 Neoplasm of uncertain behavior of skin: Secondary | ICD-10-CM | POA: Diagnosis not present

## 2019-11-19 DIAGNOSIS — D1801 Hemangioma of skin and subcutaneous tissue: Secondary | ICD-10-CM | POA: Diagnosis not present

## 2019-11-19 DIAGNOSIS — Z85828 Personal history of other malignant neoplasm of skin: Secondary | ICD-10-CM | POA: Diagnosis not present

## 2019-11-19 DIAGNOSIS — D2239 Melanocytic nevi of other parts of face: Secondary | ICD-10-CM | POA: Diagnosis not present

## 2019-11-19 DIAGNOSIS — D225 Melanocytic nevi of trunk: Secondary | ICD-10-CM | POA: Diagnosis not present

## 2020-02-15 ENCOUNTER — Other Ambulatory Visit: Payer: Self-pay | Admitting: Allergy and Immunology

## 2020-02-18 ENCOUNTER — Other Ambulatory Visit: Payer: Self-pay | Admitting: Allergy and Immunology

## 2020-02-18 DIAGNOSIS — M9903 Segmental and somatic dysfunction of lumbar region: Secondary | ICD-10-CM | POA: Diagnosis not present

## 2020-02-18 DIAGNOSIS — M7918 Myalgia, other site: Secondary | ICD-10-CM | POA: Diagnosis not present

## 2020-02-18 DIAGNOSIS — M545 Low back pain, unspecified: Secondary | ICD-10-CM | POA: Diagnosis not present

## 2020-02-18 DIAGNOSIS — M546 Pain in thoracic spine: Secondary | ICD-10-CM | POA: Diagnosis not present

## 2020-02-18 DIAGNOSIS — M542 Cervicalgia: Secondary | ICD-10-CM | POA: Diagnosis not present

## 2020-02-18 DIAGNOSIS — M9901 Segmental and somatic dysfunction of cervical region: Secondary | ICD-10-CM | POA: Diagnosis not present

## 2020-02-18 DIAGNOSIS — M9902 Segmental and somatic dysfunction of thoracic region: Secondary | ICD-10-CM | POA: Diagnosis not present

## 2020-02-18 DIAGNOSIS — M7912 Myalgia of auxiliary muscles, head and neck: Secondary | ICD-10-CM | POA: Diagnosis not present

## 2020-02-18 MED FILL — ALBUTEROL SULFATE HFA 108 (: 108 (90 BAS | 50 days supply | Qty: 9 | Fill #0

## 2020-02-21 DIAGNOSIS — M546 Pain in thoracic spine: Secondary | ICD-10-CM | POA: Diagnosis not present

## 2020-02-21 DIAGNOSIS — M9901 Segmental and somatic dysfunction of cervical region: Secondary | ICD-10-CM | POA: Diagnosis not present

## 2020-02-21 DIAGNOSIS — M7912 Myalgia of auxiliary muscles, head and neck: Secondary | ICD-10-CM | POA: Diagnosis not present

## 2020-02-21 DIAGNOSIS — M545 Low back pain, unspecified: Secondary | ICD-10-CM | POA: Diagnosis not present

## 2020-02-21 DIAGNOSIS — M9903 Segmental and somatic dysfunction of lumbar region: Secondary | ICD-10-CM | POA: Diagnosis not present

## 2020-02-21 DIAGNOSIS — M7918 Myalgia, other site: Secondary | ICD-10-CM | POA: Diagnosis not present

## 2020-02-21 DIAGNOSIS — M542 Cervicalgia: Secondary | ICD-10-CM | POA: Diagnosis not present

## 2020-02-21 DIAGNOSIS — M9902 Segmental and somatic dysfunction of thoracic region: Secondary | ICD-10-CM | POA: Diagnosis not present

## 2020-02-29 DIAGNOSIS — M9902 Segmental and somatic dysfunction of thoracic region: Secondary | ICD-10-CM | POA: Diagnosis not present

## 2020-02-29 DIAGNOSIS — M7918 Myalgia, other site: Secondary | ICD-10-CM | POA: Diagnosis not present

## 2020-02-29 DIAGNOSIS — M542 Cervicalgia: Secondary | ICD-10-CM | POA: Diagnosis not present

## 2020-02-29 DIAGNOSIS — M9903 Segmental and somatic dysfunction of lumbar region: Secondary | ICD-10-CM | POA: Diagnosis not present

## 2020-02-29 DIAGNOSIS — M7912 Myalgia of auxiliary muscles, head and neck: Secondary | ICD-10-CM | POA: Diagnosis not present

## 2020-02-29 DIAGNOSIS — M545 Low back pain, unspecified: Secondary | ICD-10-CM | POA: Diagnosis not present

## 2020-02-29 DIAGNOSIS — M546 Pain in thoracic spine: Secondary | ICD-10-CM | POA: Diagnosis not present

## 2020-02-29 DIAGNOSIS — M9901 Segmental and somatic dysfunction of cervical region: Secondary | ICD-10-CM | POA: Diagnosis not present

## 2020-03-03 MED FILL — ALBUTEROL SULFATE HFA 108 (: 108 (90 BAS | 50 days supply | Qty: 9 | Fill #0

## 2020-05-06 ENCOUNTER — Other Ambulatory Visit (HOSPITAL_BASED_OUTPATIENT_CLINIC_OR_DEPARTMENT_OTHER): Payer: Self-pay | Admitting: Internal Medicine

## 2020-05-06 ENCOUNTER — Ambulatory Visit: Payer: 59 | Attending: Internal Medicine

## 2020-05-06 DIAGNOSIS — Z23 Encounter for immunization: Secondary | ICD-10-CM

## 2020-05-06 MED FILL — PFIZER-BIONTECH COVID-19 VA: 30 | 21 days supply | Qty: 0 | Fill #0

## 2020-05-06 NOTE — Progress Notes (Signed)
   Covid-19 Vaccination Clinic  Name:  Holly Lane    MRN: [id number] DOB: January 19, 1981  05/06/2020  [name] was observed post Covid-19 immunization for 15 minutes without incident. She was provided with Vaccine Information Sheet and instruction to access the V-Safe system.   Vaccinated By: Allayne Butcher  [name] was instructed to call 911 with any severe reactions post vaccine: Marland Kitchen Difficulty breathing  . Swelling of face and throat  . A fast heartbeat  . A bad rash all over body  . Dizziness and weakness   Immunizations Administered    Name Date Dose VIS Date Route   Pfizer COVID-19 Vaccine 05/06/2020 11:52 AM 0.3 mL 01/30/2020 Intramuscular   Manufacturer: [place]   Lot: [id number]   [place]: [address]-3

## 2020-06-18 DIAGNOSIS — S76011A Strain of muscle, fascia and tendon of right hip, initial encounter: Secondary | ICD-10-CM | POA: Diagnosis not present

## 2020-06-18 DIAGNOSIS — M25551 Pain in right hip: Secondary | ICD-10-CM | POA: Diagnosis not present

## 2020-06-18 DIAGNOSIS — M7918 Myalgia, other site: Secondary | ICD-10-CM | POA: Diagnosis not present

## 2020-06-18 DIAGNOSIS — M546 Pain in thoracic spine: Secondary | ICD-10-CM | POA: Diagnosis not present

## 2020-06-18 DIAGNOSIS — M545 Low back pain, unspecified: Secondary | ICD-10-CM | POA: Diagnosis not present

## 2020-06-18 DIAGNOSIS — M9904 Segmental and somatic dysfunction of sacral region: Secondary | ICD-10-CM | POA: Diagnosis not present

## 2020-06-18 DIAGNOSIS — M9905 Segmental and somatic dysfunction of pelvic region: Secondary | ICD-10-CM | POA: Diagnosis not present

## 2020-06-18 DIAGNOSIS — M9903 Segmental and somatic dysfunction of lumbar region: Secondary | ICD-10-CM | POA: Diagnosis not present

## 2020-06-18 DIAGNOSIS — M9902 Segmental and somatic dysfunction of thoracic region: Secondary | ICD-10-CM | POA: Diagnosis not present

## 2020-06-23 DIAGNOSIS — M7918 Myalgia, other site: Secondary | ICD-10-CM | POA: Diagnosis not present

## 2020-06-23 DIAGNOSIS — M9903 Segmental and somatic dysfunction of lumbar region: Secondary | ICD-10-CM | POA: Diagnosis not present

## 2020-06-23 DIAGNOSIS — M9902 Segmental and somatic dysfunction of thoracic region: Secondary | ICD-10-CM | POA: Diagnosis not present

## 2020-06-23 DIAGNOSIS — S76011A Strain of muscle, fascia and tendon of right hip, initial encounter: Secondary | ICD-10-CM | POA: Diagnosis not present

## 2020-06-23 DIAGNOSIS — M9904 Segmental and somatic dysfunction of sacral region: Secondary | ICD-10-CM | POA: Diagnosis not present

## 2020-06-23 DIAGNOSIS — M9905 Segmental and somatic dysfunction of pelvic region: Secondary | ICD-10-CM | POA: Diagnosis not present

## 2020-06-23 DIAGNOSIS — M546 Pain in thoracic spine: Secondary | ICD-10-CM | POA: Diagnosis not present

## 2020-06-23 DIAGNOSIS — M25551 Pain in right hip: Secondary | ICD-10-CM | POA: Diagnosis not present

## 2020-06-23 DIAGNOSIS — M545 Low back pain, unspecified: Secondary | ICD-10-CM | POA: Diagnosis not present

## 2020-06-23 NOTE — Progress Notes (Signed)
I, Peterson Lombard, LAT, ATC acting as a scribe for Lynne Leader, MD.  Subjective:    CC: Right hip pain  HPI: Pt is a 40 y/o female c/o R hip pain ongoing for about 3 weeks. MOI: Pt was training for a marathon when pain developed. Pt reports limited AROM do to pain and muscle tightness. Pt has not been running for over a week. Pt locates pain to lateral aspect of R hip and into R hip flexor. Pt reports increased pain after sitting. When running, pain would radiate into anterior thigh. Pt notes some low back pain and has a PMHx of TFL spasm that was treated w/ steroid injection. Pt has a rx for diclofenac that was prescribed to treat menstrual cramps that she has been taking for hip pain.  Patient is an avid runner.  She frequently runs marathons or ultra marathon distances.  She plans on running a marathon at the end of April and has a 12-hour race at the end of the year that she wants to run if possible.  She typically runs between 30 and 50 miles per week depending on what she is training for.  Treatments tried: sports massage, chiro, foam rolling, stretching  Pertinent review of Systems: No fevers or chills  Relevant historical information: Asthma.  Previous history of stress fracture metatarsal.  Takes vitamin D.   Objective:    Vitals:   06/24/20 1412  BP: 96/62  Pulse: 72  SpO2: 100%   General: Well Developed, well nourished, and in no acute distress.   MSK: Right hip normal-appearing Normal motion. Nontender. Hip abduction and strength reduced to 4/5. External rotation strength diminished 4/5. Positive abnormal hop test.  Left hip normal-appearing nontender normal motion.  Hip abduction and external rotation strength are diminished 4+/5. Negative normal hop test.  Leg lengths equal   Lab and Radiology Results  X-ray images right hip obtained today personally and independently interpreted No acute fractures or stress fractures visible.  No significant  degenerative changes. Await formal radiology review   Impression and Recommendations:    Assessment and Plan: 40 y.o. female with right lateral to anterior hip pain in a runner.  Most dominant finding is weakness of hip abductor and external rotators which fits with hip abductor tendinopathy.  Patient is a good candidate for physical therapy and home exercise program as taught in clinic today by ATC.  Remain out of running for 4 weeks and reassess at that point.  However I am concerned.  She failed a hop test which is a diagnostic test for femoral neck stress fracture.  Although I cannot see a stress fracture on x-ray if she is failing to improve MRI would be the next diagnostic test to evaluate this further.  Keep me updated consider MRI if needed in the near future.Marland Kitchen  PDMP not reviewed this encounter. Orders Placed This Encounter  Procedures  . DG HIP UNILAT W OR W/O PELVIS 2-3 VIEWS RIGHT    Standing Status:   Future    Number of Occurrences:   1    Standing Expiration Date:   06/24/2021    Order Specific Question:   Reason for Exam (SYMPTOM  OR DIAGNOSIS REQUIRED)    Answer:   chronic right hip pain    Order Specific Question:   Preferred imaging location?    Answer:   [facility]    Order Specific Question:   Is patient pregnant?    Answer:   No  .  Ambulatory referral to Physical Therapy    Referral Priority:   Routine    Referral Type:   Physical Medicine    Referral Reason:   Specialty Services Required    Requested Specialty:   Physical Therapy   No orders of the defined types were placed in this encounter.   Discussed warning signs or symptoms. Please see discharge instructions. Patient expresses understanding.   The above documentation has been reviewed and is accurate and complete Lynne Leader, M.D.

## 2020-06-24 ENCOUNTER — Ambulatory Visit (INDEPENDENT_AMBULATORY_CARE_PROVIDER_SITE_OTHER): Payer: 59 | Admitting: Family Medicine

## 2020-06-24 ENCOUNTER — Ambulatory Visit: Payer: Self-pay

## 2020-06-24 ENCOUNTER — Other Ambulatory Visit: Payer: Self-pay

## 2020-06-24 ENCOUNTER — Ambulatory Visit (INDEPENDENT_AMBULATORY_CARE_PROVIDER_SITE_OTHER): Payer: 59

## 2020-06-24 VITALS — BP 96/62 | HR 72 | Ht 66.0 in | Wt 130.8 lb

## 2020-06-24 DIAGNOSIS — G8929 Other chronic pain: Secondary | ICD-10-CM

## 2020-06-24 DIAGNOSIS — M25551 Pain in right hip: Secondary | ICD-10-CM

## 2020-06-24 NOTE — Patient Instructions (Addendum)
Thank you for coming in today.  Please complete the exercises that the athletic trainer went over with you: View at [email or web address] using code: [id number]  I've referred you to Physical Therapy.  Let us know if you don't hear from them in one week.  Please get an Xray today before you leave  Let me know if you do not improving in 1 month or 6 weeks.   If not better next step is MRI.

## 2020-06-25 ENCOUNTER — Encounter: Payer: Self-pay | Admitting: Family Medicine

## 2020-06-26 ENCOUNTER — Telehealth: Payer: Self-pay | Admitting: Family Medicine

## 2020-06-26 NOTE — Telephone Encounter (Signed)
Patient called asking if someone would be able to call her to discuss her visit with Dr Georgina Snell.

## 2020-06-26 NOTE — Progress Notes (Signed)
X-ray right hip looks normal to radiology.

## 2020-06-26 NOTE — Telephone Encounter (Signed)
Returned pt's call and LM for her to either send a MyChart message with specific questions she has regarding her visit w/ Dr. Georgina Snell or to return call and let us know specifically what questions she has.

## 2020-06-27 DIAGNOSIS — M9905 Segmental and somatic dysfunction of pelvic region: Secondary | ICD-10-CM | POA: Diagnosis not present

## 2020-06-27 DIAGNOSIS — M7918 Myalgia, other site: Secondary | ICD-10-CM | POA: Diagnosis not present

## 2020-06-27 DIAGNOSIS — M9902 Segmental and somatic dysfunction of thoracic region: Secondary | ICD-10-CM | POA: Diagnosis not present

## 2020-06-27 DIAGNOSIS — M25551 Pain in right hip: Secondary | ICD-10-CM | POA: Diagnosis not present

## 2020-06-27 DIAGNOSIS — M9904 Segmental and somatic dysfunction of sacral region: Secondary | ICD-10-CM | POA: Diagnosis not present

## 2020-06-27 DIAGNOSIS — S76011A Strain of muscle, fascia and tendon of right hip, initial encounter: Secondary | ICD-10-CM | POA: Diagnosis not present

## 2020-06-27 DIAGNOSIS — M9903 Segmental and somatic dysfunction of lumbar region: Secondary | ICD-10-CM | POA: Diagnosis not present

## 2020-06-27 DIAGNOSIS — M545 Low back pain, unspecified: Secondary | ICD-10-CM | POA: Diagnosis not present

## 2020-06-27 DIAGNOSIS — M546 Pain in thoracic spine: Secondary | ICD-10-CM | POA: Diagnosis not present

## 2020-07-03 DIAGNOSIS — M9904 Segmental and somatic dysfunction of sacral region: Secondary | ICD-10-CM | POA: Diagnosis not present

## 2020-07-03 DIAGNOSIS — M9902 Segmental and somatic dysfunction of thoracic region: Secondary | ICD-10-CM | POA: Diagnosis not present

## 2020-07-03 DIAGNOSIS — M545 Low back pain, unspecified: Secondary | ICD-10-CM | POA: Diagnosis not present

## 2020-07-03 DIAGNOSIS — M546 Pain in thoracic spine: Secondary | ICD-10-CM | POA: Diagnosis not present

## 2020-07-03 DIAGNOSIS — S76011A Strain of muscle, fascia and tendon of right hip, initial encounter: Secondary | ICD-10-CM | POA: Diagnosis not present

## 2020-07-03 DIAGNOSIS — M25551 Pain in right hip: Secondary | ICD-10-CM | POA: Diagnosis not present

## 2020-07-03 DIAGNOSIS — M9905 Segmental and somatic dysfunction of pelvic region: Secondary | ICD-10-CM | POA: Diagnosis not present

## 2020-07-03 DIAGNOSIS — M9903 Segmental and somatic dysfunction of lumbar region: Secondary | ICD-10-CM | POA: Diagnosis not present

## 2020-07-03 DIAGNOSIS — M7918 Myalgia, other site: Secondary | ICD-10-CM | POA: Diagnosis not present

## 2020-07-09 ENCOUNTER — Other Ambulatory Visit: Payer: Self-pay

## 2020-07-09 ENCOUNTER — Ambulatory Visit (INDEPENDENT_AMBULATORY_CARE_PROVIDER_SITE_OTHER): Payer: 59 | Admitting: Family Medicine

## 2020-07-09 VITALS — BP 112/68 | Ht 66.0 in | Wt 128.0 lb

## 2020-07-09 DIAGNOSIS — M25551 Pain in right hip: Secondary | ICD-10-CM | POA: Diagnosis not present

## 2020-07-09 DIAGNOSIS — G8929 Other chronic pain: Secondary | ICD-10-CM

## 2020-07-09 NOTE — Patient Instructions (Signed)
I'm concerned you have a stress fracture of your hip. We will go ahead with an MRI and I will call you with the results. Wear crutches and try not to put any weight on this leg in the meantime. Stop the home exercises you were doing. Ok to take tylenol, the diclofenac if needed.

## 2020-07-10 ENCOUNTER — Encounter: Payer: Self-pay | Admitting: Family Medicine

## 2020-07-10 NOTE — Progress Notes (Signed)
PCP: Libby Maw, MD  Subjective:   HPI: Patient is a 40 y.o. female here for right hip pain.  Patient is an avid runner including marathons. She reports long history of hip, hamstring tightness. In January was increasing mileage (typically averages 30 miles per week) and started to notice increasing lateral right hip and groin pain. No acute injury. No swelling or bruising noted. Sits at work and notices this makes pain worse. Maybe some better with running but worse afterwards. Had benefit with diclofenac. She has tried runs over past several weeks but limps through these. Saw Dr. Georgina Snell on 3/15 with discussion of PT, dry needling and consideration of MRI if not improving. Doing home exercises, stretches.  Past Medical History:  Diagnosis Date  . Asthma   . Eczema   . Infertility, female   . Migraine   . Urticaria     Current Outpatient Medications on File Prior to Visit  Medication Sig Dispense Refill  . albuterol (VENTOLIN HFA) 108 (90 Base) MCG/ACT inhaler INHALE 1 PUFF BY MOUTH INTO THE LUNGS EVERY 6 HOURS AS NEEDED FOR WHEEZING AND SHORTNESS OF BREATH 6.7 g 0  . Calcium-Magnesium-Vitamin D (CALCIUM 1200+D3 PO) Take by mouth.    . cholecalciferol (VITAMIN D3) 25 MCG (1000 UNIT) tablet Take 1,000 Units by mouth daily.    . diclofenac (CATAFLAM) 50 MG tablet Take 1 tablet (50 mg total) by mouth 3 (three) times daily. 60 tablet 2  . Magnesium Oxide 200 MG TABS Take 2 tablets by mouth daily.    . montelukast (SINGULAIR) 10 MG tablet Take 1 tablet (10 mg total) by mouth at bedtime. (Patient not taking: Reported on 07/26/2019) 30 tablet 5  . Prenatal Multivit-Min-Fe-FA (PRENATAL/IRON PO) Take by mouth.     No current facility-administered medications on file prior to visit.    History reviewed. No pertinent surgical history.  Allergies  Allergen Reactions  . Penicillins     Social History   Socioeconomic History  . Marital status: Married    Spouse name:  Dorothea Ogle  . Number of children: 0  . Years of education: Masters  . Highest education level: Master's degree (e.g., MA, MS, MEng, MEd, MSW, MBA)  Occupational History  . Not on file  Tobacco Use  . Smoking status: Never Smoker  . Smokeless tobacco: Never Used  Vaping Use  . Vaping Use: Never used  Substance and Sexual Activity  . Alcohol use: Yes    Comment: social  . Drug use: Never  . Sexual activity: Yes  Other Topics Concern  . Not on file  Social History Narrative  . Not on file   Social Determinants of Health   Financial Resource Strain: Not on file  Food Insecurity: Not on file  Transportation Needs: Not on file  Physical Activity: Not on file  Stress: Not on file  Social Connections: Not on file  Intimate Partner Violence: Not on file    Family History  Problem Relation Age of Onset  . Healthy Mother   . Allergic rhinitis Mother   . Eczema Mother   . Healthy Father   . Allergic rhinitis Father   . Migraines Sister   . Asthma Brother   . Angioedema Neg Hx   . Immunodeficiency Neg Hx   . Urticaria Neg Hx     BP 112/68   Ht 5\' 6"  (1.676 m)   Wt 128 lb (58.1 kg)   LMP 06/24/2020   BMI 20.66 kg/m   No  flowsheet data found.  No flowsheet data found.  Review of Systems: See HPI above.     Objective:  Physical Exam:  Gen: NAD, comfortable in exam room  Back: No gross deformity, scoliosis. No paraspinal TTP.  No midline or bony TTP. FROM without reproduction of pain. Strength LEs 5/5 all muscle groups except 4/5 right hip abduction.   Negative SLRs. Sensation intact to light touch bilaterally.  Right hip: No deformity. FROM with 5/5 strength except 4/5 right hip abduction. Mild tenderness throughout anterior and lateral hip. NVI distally. Negative logroll Negative faber, fadir, and piriformis stretches. Positive hop test reproducing pain.   Assessment & Plan:  1. 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  Concern for proximal femur, hip stress fracture.  Radiographs were negative.  Will place on crutches and advised not to bear weight on right leg while waiting on MRI.  Stop home exercises.  Tylenol, diclofenac if needed.

## 2020-07-17 DIAGNOSIS — S73191A Other sprain of right hip, initial encounter: Secondary | ICD-10-CM | POA: Diagnosis not present

## 2020-07-25 ENCOUNTER — Encounter: Payer: Self-pay | Admitting: Family Medicine

## 2020-08-06 ENCOUNTER — Ambulatory Visit (INDEPENDENT_AMBULATORY_CARE_PROVIDER_SITE_OTHER): Payer: 59 | Admitting: Family Medicine

## 2020-08-06 ENCOUNTER — Other Ambulatory Visit: Payer: Self-pay

## 2020-08-06 VITALS — BP 100/68 | Ht 66.0 in | Wt 128.0 lb

## 2020-08-06 DIAGNOSIS — M84351A Stress fracture, right femur, initial encounter for fracture: Secondary | ICD-10-CM

## 2020-08-07 ENCOUNTER — Encounter: Payer: Self-pay | Admitting: Family Medicine

## 2020-08-07 NOTE — Progress Notes (Signed)
PCP: Libby Maw, MD  Subjective:   HPI: Patient is a 40 y.o. female here for right hip pain.  3/30: Patient is an avid runner including marathons. She reports long history of hip, hamstring tightness. In January was increasing mileage (typically averages 30 miles per week) and started to notice increasing lateral right hip and groin pain. No acute injury. No swelling or bruising noted. Sits at work and notices this makes pain worse. Maybe some better with running but worse afterwards. Had benefit with diclofenac. She has tried runs over past several weeks but limps through these. Saw Dr. Georgina Snell on 3/15 with discussion of PT, dry needling and consideration of MRI if not improving. Doing home exercises, stretches.  4/27: Patient reports she has been strictly adherent to non weight bearing with crutches. She has not had any pain though feels she's losing a lot of muscle mass. The pain she had when turning over in bed that would wake her up has resolved. She's now 4 weeks out since starting non weight bearing.  Past Medical History:  Diagnosis Date  . Asthma   . Eczema   . Infertility, female   . Migraine   . Urticaria     Current Outpatient Medications on File Prior to Visit  Medication Sig Dispense Refill  . albuterol (VENTOLIN HFA) 108 (90 Base) MCG/ACT inhaler INHALE 1 PUFF BY MOUTH INTO THE LUNGS EVERY 6 HOURS AS NEEDED FOR WHEEZING AND SHORTNESS OF BREATH 8.5 g 0  . Calcium-Magnesium-Vitamin D (CALCIUM 1200+D3 PO) Take by mouth.    . cholecalciferol (VITAMIN D3) 25 MCG (1000 UNIT) tablet Take 1,000 Units by mouth daily.    Marland Kitchen COVID-19 mRNA vaccine, Pfizer, 30 MCG/0.3ML injection INJECT AS DIRECTED .3 mL 0  . diclofenac (CATAFLAM) 50 MG tablet Take 1 tablet (50 mg total) by mouth 3 (three) times daily. 60 tablet 2  . Magnesium Oxide 200 MG TABS Take 2 tablets by mouth daily.    . montelukast (SINGULAIR) 10 MG tablet Take 1 tablet (10 mg total) by mouth at  bedtime. (Patient not taking: Reported on 07/26/2019) 30 tablet 5  . Prenatal Multivit-Min-Fe-FA (PRENATAL/IRON PO) Take by mouth.     No current facility-administered medications on file prior to visit.    History reviewed. No pertinent surgical history.  Allergies  Allergen Reactions  . Penicillins     Social History   Socioeconomic History  . Marital status: Married    Spouse name: Dorothea Ogle  . Number of children: 0  . Years of education: Masters  . Highest education level: Master's degree (e.g., MA, MS, MEng, MEd, MSW, MBA)  Occupational History  . Not on file  Tobacco Use  . Smoking status: Never Smoker  . Smokeless tobacco: Never Used  Vaping Use  . Vaping Use: Never used  Substance and Sexual Activity  . Alcohol use: Yes    Comment: social  . Drug use: Never  . Sexual activity: Yes  Other Topics Concern  . Not on file  Social History Narrative  . Not on file   Social Determinants of Health   Financial Resource Strain: Not on file  Food Insecurity: Not on file  Transportation Needs: Not on file  Physical Activity: Not on file  Stress: Not on file  Social Connections: Not on file  Intimate Partner Violence: Not on file    Family History  Problem Relation Age of Onset  . Healthy Mother   . Allergic rhinitis Mother   . Eczema  Mother   . Healthy Father   . Allergic rhinitis Father   . Migraines Sister   . Asthma Brother   . Angioedema Neg Hx   . Immunodeficiency Neg Hx   . Urticaria Neg Hx     BP 100/68   Ht 5\' 6"  (1.676 m)   Wt 128 lb (58.1 kg)   BMI 20.66 kg/m   No flowsheet data found.  No flowsheet data found.  Review of Systems: See HPI above.     Objective:  Physical Exam:  Gen: NAD, comfortable in exam room  Right hip: No deformity. FROM with 5/5 strength. No tenderness to palpation. NVI distally. Negative logroll Negative faber, fadir, fulcrum. Able to stand single leg and walk in office without pain.   Assessment & Plan:   1. Right hip pain - 2/2 right compression sided femoral neck stress fracture.  She is 4 weeks out non weight bearing.  We reviewed the protocol and provided this from [employer] for her.  She will start weight bearing now and for next 2 weeks.  Advised not to exercise otherwise, contact us if she gets any pain with this.  Only motion exercises, some basic stretches.  Then in 2 weeks she can start with swimming, stationary bike, water running if these do not cause pain.  Plan to follow up in 4 weeks and hopefully at that time can advance her to walking briskly for exercise, elliptical.  We discussed previously while she has evidence of labral tear on MRI her exam indicates this is not symptomatic.

## 2020-08-22 DIAGNOSIS — D367 Benign neoplasm of other specified sites: Secondary | ICD-10-CM | POA: Diagnosis not present

## 2020-09-03 ENCOUNTER — Ambulatory Visit (INDEPENDENT_AMBULATORY_CARE_PROVIDER_SITE_OTHER): Payer: 59 | Admitting: Family Medicine

## 2020-09-03 ENCOUNTER — Encounter: Payer: Self-pay | Admitting: Family Medicine

## 2020-09-03 ENCOUNTER — Other Ambulatory Visit: Payer: Self-pay

## 2020-09-03 VITALS — BP 106/70 | Ht 66.0 in | Wt 128.0 lb

## 2020-09-03 DIAGNOSIS — M84351A Stress fracture, right femur, initial encounter for fracture: Secondary | ICD-10-CM | POA: Diagnosis not present

## 2020-09-03 NOTE — Patient Instructions (Signed)
Start at week 9 of the program and follow this diligently.  Consider only increasing by 0.25miles each time you run for the first few runs though. Get fitted at [facility] - you need to ensure you have appropriate arch support so you're not getting plantar fasciitis, overusing your peroneals and post tib tendons. Ok to do hamstring, calf stretching now and do yoga. Follow up with me in 4 weeks.

## 2020-09-03 NOTE — Progress Notes (Signed)
PCP: Libby Maw, MD  Subjective:   HPI: Patient is a 40 y.o. female here for right hip pain.  3/30: Patient is an avid runner including marathons. She reports long history of hip, hamstring tightness. In January was increasing mileage (typically averages 30 miles per week) and started to notice increasing lateral right hip and groin pain. No acute injury. No swelling or bruising noted. Sits at work and notices this makes pain worse. Maybe some better with running but worse afterwards. Had benefit with diclofenac. She has tried runs over past several weeks but limps through these. Saw Dr. Georgina Snell on 3/15 with discussion of PT, dry needling and consideration of MRI if not improving. Doing home exercises, stretches.  4/27: Patient reports she has been strictly adherent to non weight bearing with crutches. She has not had any pain though feels she's losing a lot of muscle mass. The pain she had when turning over in bed that would wake her up has resolved. She's now 4 weeks out since starting non weight bearing.  5/25: Patient reports improvement in her hip pain. Past 2 weeks has been walking more for exercise but not running yet.  Has not swam or cycled for exercise - did not want to join a gym just for a couple weeks as she doesn't really enjoy these other forms of cardio. Noting left calf feeling more tight along with hamstrings and feels like she's getting plantar fasciitis.  Past Medical History:  Diagnosis Date  . Asthma   . Eczema   . Infertility, female   . Migraine   . Urticaria     Current Outpatient Medications on File Prior to Visit  Medication Sig Dispense Refill  . albuterol (VENTOLIN HFA) 108 (90 Base) MCG/ACT inhaler INHALE 1 PUFF BY MOUTH INTO THE LUNGS EVERY 6 HOURS AS NEEDED FOR WHEEZING AND SHORTNESS OF BREATH 8.5 g 0  . Calcium-Magnesium-Vitamin D (CALCIUM 1200+D3 PO) Take by mouth.    . cholecalciferol (VITAMIN D3) 25 MCG (1000 UNIT) tablet Take  1,000 Units by mouth daily.    Marland Kitchen COVID-19 mRNA vaccine, Pfizer, 30 MCG/0.3ML injection INJECT AS DIRECTED .3 mL 0  . diclofenac (CATAFLAM) 50 MG tablet Take 1 tablet (50 mg total) by mouth 3 (three) times daily. 60 tablet 2  . Magnesium Oxide 200 MG TABS Take 2 tablets by mouth daily.    . montelukast (SINGULAIR) 10 MG tablet Take 1 tablet (10 mg total) by mouth at bedtime. (Patient not taking: Reported on 07/26/2019) 30 tablet 5  . Prenatal Multivit-Min-Fe-FA (PRENATAL/IRON PO) Take by mouth.     No current facility-administered medications on file prior to visit.    History reviewed. No pertinent surgical history.  Allergies  Allergen Reactions  . Penicillins     Social History   Socioeconomic History  . Marital status: Married    Spouse name: Dorothea Ogle  . Number of children: 0  . Years of education: Masters  . Highest education level: Master's degree (e.g., MA, MS, MEng, MEd, MSW, MBA)  Occupational History  . Not on file  Tobacco Use  . Smoking status: Never Smoker  . Smokeless tobacco: Never Used  Vaping Use  . Vaping Use: Never used  Substance and Sexual Activity  . Alcohol use: Yes    Comment: social  . Drug use: Never  . Sexual activity: Yes  Other Topics Concern  . Not on file  Social History Narrative  . Not on file   Social Determinants of Health  Financial Resource Strain: Not on file  Food Insecurity: Not on file  Transportation Needs: Not on file  Physical Activity: Not on file  Stress: Not on file  Social Connections: Not on file  Intimate Partner Violence: Not on file    Family History  Problem Relation Age of Onset  . Healthy Mother   . Allergic rhinitis Mother   . Eczema Mother   . Healthy Father   . Allergic rhinitis Father   . Migraines Sister   . Asthma Brother   . Angioedema Neg Hx   . Immunodeficiency Neg Hx   . Urticaria Neg Hx     BP 106/70   Ht 5\' 6"  (1.676 m)   Wt 128 lb (58.1 kg)   BMI 20.66 kg/m   No flowsheet data  found.  No flowsheet data found.  Review of Systems: See HPI above.     Objective:  Physical Exam:  Gen: NAD, comfortable in exam room  Right hip: No deformity. FROM with 5/5 strength. No tenderness to palpation. NVI distally. Negative logroll Negative fulcrum, fadir, hop test.  Able to walk without pain.   Assessment & Plan:  1. Right hip pain - 2/2 right compression sided femoral neck stress fracture.  Now 8 weeks out having completed 4 weeks of non weight bearing.  Negative hop test and done well with walking for exercise.  Reviewed protocol with her - start beginning of week 9.  Discussed fitting at [facility] for appropriate shoes - was describing when she does long runs getting peroneal pain and has had medial pain over post tib also with her pes cavus.  F/u in 4 weeks.

## 2020-10-06 ENCOUNTER — Other Ambulatory Visit: Payer: Self-pay

## 2020-10-06 ENCOUNTER — Other Ambulatory Visit (HOSPITAL_BASED_OUTPATIENT_CLINIC_OR_DEPARTMENT_OTHER): Payer: Self-pay

## 2020-10-06 ENCOUNTER — Ambulatory Visit (INDEPENDENT_AMBULATORY_CARE_PROVIDER_SITE_OTHER): Payer: 59 | Admitting: Family Medicine

## 2020-10-06 ENCOUNTER — Encounter: Payer: Self-pay | Admitting: Family Medicine

## 2020-10-06 VITALS — BP 110/60 | Ht 66.0 in | Wt 128.0 lb

## 2020-10-06 DIAGNOSIS — M84351A Stress fracture, right femur, initial encounter for fracture: Secondary | ICD-10-CM | POA: Insufficient documentation

## 2020-10-06 MED ORDER — ALBUTEROL SULFATE HFA 108 (90 BASE) MCG/ACT IN AERS
1.0000 | INHALATION_SPRAY | Freq: Four times a day (QID) | RESPIRATORY_TRACT | 5 refills | Status: DC | PRN
  Filled 2020-10-06 – 2020-12-25 (×2): qty 8.5, 25d supply, fill #0

## 2020-10-06 NOTE — Assessment & Plan Note (Addendum)
Continues to progress well through protocol. Negative hop test today. Patient to continue protocol. Recommended 20% increase in distance running per week once she finishes protocol and can consider back to back running days when able to tolerate 8 miles of running without pain. She voiced understanding and agreement with plan. Follow up 4 weeks for continued monitoring, sooner if develops pain. Will reach out to Boyce Medici at [facility] for nutrition consult regarding protein intake for vegetarian who is a avid long distance runner.

## 2020-10-06 NOTE — Progress Notes (Signed)
Subjective:   Patient ID: Holly Lane    DOB: [birth year], 40 y.o. female   MRN: [id number]  CARLI LEFEVERS is a 40 y.o. female with a history of medial right femoral neck fracture here for follow up.   Copied from prior Progress note: 3/30: Patient is an avid runner including marathons. She reports long history of hip, hamstring tightness. In January was increasing mileage (typically averages 30 miles per week) and started to notice increasing lateral right hip and groin pain. No acute injury. No swelling or bruising noted. Sits at work and notices this makes pain worse. Maybe some better with running but worse afterwards. Had benefit with diclofenac. She has tried runs over past several weeks but limps through these. Saw Dr. Georgina Snell on 3/15 with discussion of PT, dry needling and consideration of MRI if not improving. Doing home exercises, stretches.   4/27: Patient reports she has been strictly adherent to non weight bearing with crutches. She has not had any pain though feels she's losing a lot of muscle mass. The pain she had when turning over in bed that would wake her up has resolved. She's now 4 weeks out since starting non weight bearing.   5/25: Patient reports improvement in her hip pain. Past 2 weeks has been walking more for exercise but not running yet.  Has not swam or cycled for exercise - did not want to join a gym just for a couple weeks as she doesn't really enjoy these other forms of cardio. Noting left calf feeling more tight along with hamstrings and feels like she's getting plantar fasciitis. __________________________________________  Today (10/06/20): She has continued to progress in the treatment phases. She is currently running every other day and strength training 2x/week. She denies any pain. She notes some pain a few weeks ago. She attempted the hop test but did not illicit any pain. It was felt to be her IT band at that time. She denies any further pain.  She has questions regarding obtaining her recommended protein intake per day which is over 100g/day.  Review of Systems:  Per HPI.   Objective:   BP 110/60   Ht 5\' 6"  (1.676 m)   Wt 128 lb (58.1 kg)   BMI 20.66 kg/m  Vitals and nursing note reviewed.  General: pleasant thin middle aged woman, sitting comfortably in exam chair, well nourished, well developed, in no acute distress with non-toxic appearance Resp: breathing comfortably on room air, speaking in full sentences MSK: gait normal Neuro: Alert and oriented, speech normal  Right Hip:  - Inspection: No gross deformity, no swelling, erythema, or ecchymosis - Palpation: No TTP - ROM: Normal range of motion on Flexion, extension, abduction, internal and external rotation - Strength: Normal strength. - Neuro/vasc: NV intact distally - Special Tests: Negative FABER and FADIR.   Negative Ober's, Negative Hop test   Left Hip:  - Inspection: No gross deformity, no swelling, erythema, or ecchymosis - Palpation: No TTP, specifically none over greater trochanter - ROM: Normal range of motion on Flexion, extension, abduction, internal and external rotation - Strength: Normal strength. - Neuro/vasc: NV intact distally  Assessment & Plan:   Stress fracture of right hip Continues to progress well through protocol. Negative hop test today. Patient to continue protocol. Recommended 10-20% increase in distance running per week once she finishes protocol and can consider back to back running days when able to tolerate 8 miles of running without pain. She voiced understanding and agreement  with plan. Follow up 4 weeks for continued monitoring, sooner if develops pain. Will reach out to Boyce Medici at [facility] for nutrition consult regarding protein intake for vegetarian who is a avid long distance runner.  No orders of the defined types were placed in this encounter.  Meds ordered this encounter  Medications   albuterol (VENTOLIN HFA) 108  (90 Base) MCG/ACT inhaler    Sig: Inhale 1-2 puffs into the lungs every 6 (six) hours as needed for wheezing or shortness of breath.    Dispense:  8.5 g    Refill:  [place], DO PGY-3, [facility] 10/06/2020 9:37 AM

## 2020-10-06 NOTE — Patient Instructions (Addendum)
I am so glad you are improving. Continue to work on your step wise treatment and remember to reach out if you develop any new pain. I am reaching out to the nutritionist at [facility] to see about scheduling you for a consult regarding your protein intake. We will contact you regarding this.

## 2020-10-09 ENCOUNTER — Encounter: Payer: Self-pay | Admitting: Family Medicine

## 2020-10-14 ENCOUNTER — Other Ambulatory Visit (HOSPITAL_BASED_OUTPATIENT_CLINIC_OR_DEPARTMENT_OTHER): Payer: Self-pay

## 2020-12-25 ENCOUNTER — Other Ambulatory Visit (HOSPITAL_BASED_OUTPATIENT_CLINIC_OR_DEPARTMENT_OTHER): Payer: Self-pay

## 2021-01-08 ENCOUNTER — Encounter: Payer: Self-pay | Admitting: Family Medicine

## 2021-01-08 ENCOUNTER — Telehealth (INDEPENDENT_AMBULATORY_CARE_PROVIDER_SITE_OTHER): Payer: 59 | Admitting: Family Medicine

## 2021-01-08 VITALS — Ht 66.0 in | Wt 129.0 lb

## 2021-01-08 DIAGNOSIS — F40243 Fear of flying: Secondary | ICD-10-CM | POA: Diagnosis not present

## 2021-01-08 MED ORDER — ALPRAZOLAM 0.5 MG PO TABS: ORAL_TABLET | ORAL | 0 refills | Status: DC

## 2021-01-08 NOTE — Progress Notes (Signed)
Established Patient Office Visit  Subjective:  Patient ID: Holly Lane, female    DOB: Jul 20, 1980  Age: 40 y.o. MRN: [id number]  CC:  Chief Complaint  Patient presents with   Advice Only    Patient states that she becomes very anxious when fly would like something to help with this for her flight on 01/16/21.     HPI [name] presents for help with anxiety for flying. Xanax has helped in the past at low dose. Suffered a panic attack one time and actually had to leave the plane.  She usually takes one half or 1.5 mg pill prior to boarding and then the other half as needed.  Suffers from claustrophobia.  Distant history of anxiety with depression treated with Lexapro and as needed Xanax.  All allergy symptoms controlled with as needed Claritin.  Uses albuterol inhaler prior to exercise.  Past Medical History:  Diagnosis Date   Asthma    Eczema    Infertility, female    Migraine    Urticaria     History reviewed. No pertinent surgical history.  Family History  Problem Relation Age of Onset   Healthy Mother    Allergic rhinitis Mother    Eczema Mother    Healthy Father    Allergic rhinitis Father    Migraines Sister    Asthma Brother    Angioedema Neg Hx    Immunodeficiency Neg Hx    Urticaria Neg Hx     Social History   Socioeconomic History   Marital status: Married    Spouse name: Dorothea Ogle   Number of children: 0   Years of education: Masters   Highest education level: Master's degree (e.g., MA, MS, MEng, MEd, MSW, MBA)  Occupational History   Not on file  Tobacco Use   Smoking status: Never   Smokeless tobacco: Never  Vaping Use   Vaping Use: Never used  Substance and Sexual Activity   Alcohol use: Yes    Comment: social   Drug use: Never   Sexual activity: Yes  Other Topics Concern   Not on file  Social History Narrative   Not on file   Social Determinants of Health   Financial Resource Strain: Not on file  Food Insecurity: Not on file   Transportation Needs: Not on file  Physical Activity: Not on file  Stress: Not on file  Social Connections: Not on file  Intimate Partner Violence: Not on file    Outpatient Medications Prior to Visit  Medication Sig Dispense Refill   albuterol (VENTOLIN HFA) 108 (90 Base) MCG/ACT inhaler Inhale 1-2 puffs into the lungs every 6 (six) hours as needed for wheezing or shortness of breath. 8.5 g 5   Calcium-Magnesium-Vitamin D (CALCIUM 1200+D3 PO) Take by mouth.     cholecalciferol (VITAMIN D3) 25 MCG (1000 UNIT) tablet Take 1,000 Units by mouth daily.     diclofenac (CATAFLAM) 50 MG tablet Take 1 tablet (50 mg total) by mouth 3 (three) times daily. 60 tablet 2   Magnesium Oxide 200 MG TABS Take 2 tablets by mouth daily.     Prenatal Multivit-Min-Fe-FA (PRENATAL/IRON PO) Take by mouth.     COVID-19 mRNA vaccine, Pfizer, 30 MCG/0.3ML injection INJECT AS DIRECTED .3 mL 0   montelukast (SINGULAIR) 10 MG tablet Take 1 tablet (10 mg total) by mouth at bedtime. (Patient not taking: Reported on 07/26/2019) 30 tablet 5   No facility-administered medications prior to visit.    Allergies  Allergen Reactions   Penicillins     ROS Review of Systems  Constitutional: Negative.   Cardiovascular: Negative.   Gastrointestinal: Negative.   Psychiatric/Behavioral:  Negative for dysphoric mood. The patient is nervous/anxious.      Objective:    Physical Exam Vitals and nursing note reviewed.  Constitutional:      General: She is not in acute distress. Pulmonary:     Effort: Pulmonary effort is normal.  Abdominal:     Palpations: Abdomen is soft.  Neurological:     Mental Status: She is alert and oriented to person, place, and time.  Psychiatric:        Mood and Affect: Mood normal.        Behavior: Behavior normal.    Ht 5\' 6"  (1.676 m)   Wt 129 lb (58.5 kg)   BMI 20.82 kg/m  Wt Readings from Last 3 Encounters:  01/08/21 129 lb (58.5 kg)  10/06/20 128 lb (58.1 kg)  09/03/20 128 lb  (58.1 kg)     Health Maintenance Due  Topic Date Due   COVID-19 Vaccine (3 - Booster for Pfizer series) 10/04/2020   INFLUENZA VACCINE  11/10/2020    There are no preventive care reminders to display for this patient.  No results found for: TSH Lab Results  Component Value Date   WBC 5.8 09/28/2019   HGB 13.1 09/28/2019   HCT 39.0 09/28/2019   MCV 98.1 09/28/2019   PLT 234.0 09/28/2019   Lab Results  Component Value Date   NA 134 (L) 09/28/2019   K 3.9 09/28/2019   CO2 27 09/28/2019   GLUCOSE 85 09/28/2019   BUN 11 09/28/2019   CREATININE 0.84 09/28/2019   BILITOT 0.6 09/28/2019   ALKPHOS 47 09/28/2019   AST 22 09/28/2019   ALT 19 09/28/2019   PROT 6.6 09/28/2019   ALBUMIN 4.4 09/28/2019   CALCIUM 9.1 09/28/2019   GFR 75.07 09/28/2019   Lab Results  Component Value Date   CHOL 142 09/28/2019   Lab Results  Component Value Date   HDL 77.50 09/28/2019   Lab Results  Component Value Date   LDLCALC 56 09/28/2019   Lab Results  Component Value Date   TRIG 44.0 09/28/2019   Lab Results  Component Value Date   CHOLHDL 2 09/28/2019   No results found for: HGBA1C    Assessment & Plan:   Problem List Items Addressed This Visit   None Visit Diagnoses     Fear of flying    -  Primary   Relevant Medications   ALPRAZolam (XANAX) 0.5 MG tablet       Meds ordered this encounter  Medications   ALPRAZolam (XANAX) 0.5 MG tablet    Sig: May take 1/2 tablet prior to boarding and as needed.    Dispense:  6 tablet    Refill:  0    Follow-up: No follow-ups on file.  Patient anticipating it 6 tablets will be plenty for her.  She and her husband are planning a possible trip in the spring.  Libby Maw, MD  Virtual Visit via Telephone Note  I connected with Holly Lane on 01/08/21 at 11:30 AM EDT by telephone and verified that I am speaking with the correct person using two identifiers.  Location: Patient: at work in office alone.   Provider: work   I discussed the limitations, risks, security and privacy concerns of performing an evaluation and management service by telephone and the availability of  in person appointments. I also discussed with the patient that there may be a patient responsible charge related to this service. The patient expressed understanding and agreed to proceed.   History of Present Illness:    Observations/Objective:   Assessment and Plan:   Follow Up Instructions:    I discussed the assessment and treatment plan with the patient. The patient was provided an opportunity to ask questions and all were answered. The patient agreed with the plan and demonstrated an understanding of the instructions.   The patient was advised to call back or seek an in-person evaluation if the symptoms worsen or if the condition fails to improve as anticipated.  I provided 10 minutes of non-face-to-face time during this encounter.   Libby Maw, MD   Interactive video and audio telecommunications were attempted between myself and the patient. However they failed due to the patient having technical difficulties or not having access to video capability. We continued and completed with audio only.

## 2021-02-19 ENCOUNTER — Telehealth: Payer: Self-pay | Admitting: Family Medicine

## 2021-02-19 MED ORDER — ALBUTEROL SULFATE HFA 108 (90 BASE) MCG/ACT IN AERS: 1.0000 | INHALATION_SPRAY | Freq: Four times a day (QID) | RESPIRATORY_TRACT | 5 refills | Status: AC | PRN

## 2021-02-19 NOTE — Telephone Encounter (Signed)
-----   Message from [facility], LAT sent at 02/19/2021  9:35 AM EST ----- Regarding: FW: refill request  ----- Message ----- From: Carolyne Littles Sent: 02/19/2021   9:18 AM EST To: Jolinda Croak, LAT Subject: refill request                                 Pt is asking for refill on albuterol (VENTOLIN HFA) 108 (90 Base) MCG/ACT inhaler.  She has refills left but needs to switch her pharmacy and they won't transfer. They told her to call us. New pharmacy is  [address] in [place]

## 2021-06-16 ENCOUNTER — Other Ambulatory Visit: Payer: Self-pay | Admitting: Family Medicine

## 2021-06-16 DIAGNOSIS — F40243 Fear of flying: Secondary | ICD-10-CM

## 2021-06-16 MED ORDER — ALPRAZOLAM 0.5 MG PO TABS: ORAL_TABLET | ORAL | 0 refills | Status: AC

## 2021-08-08 IMAGING — DX DG HIP (WITH OR WITHOUT PELVIS) 2-3V*R*
3 series · 3 of 3 positions shown · non-contrast
Comparison: None.

CLINICAL DATA: Chronic right hip pain, inguinal pain for 1 month

EXAM:
DG HIP (WITH OR WITHOUT PELVIS) 2-3V RIGHT

[pelvis ap]
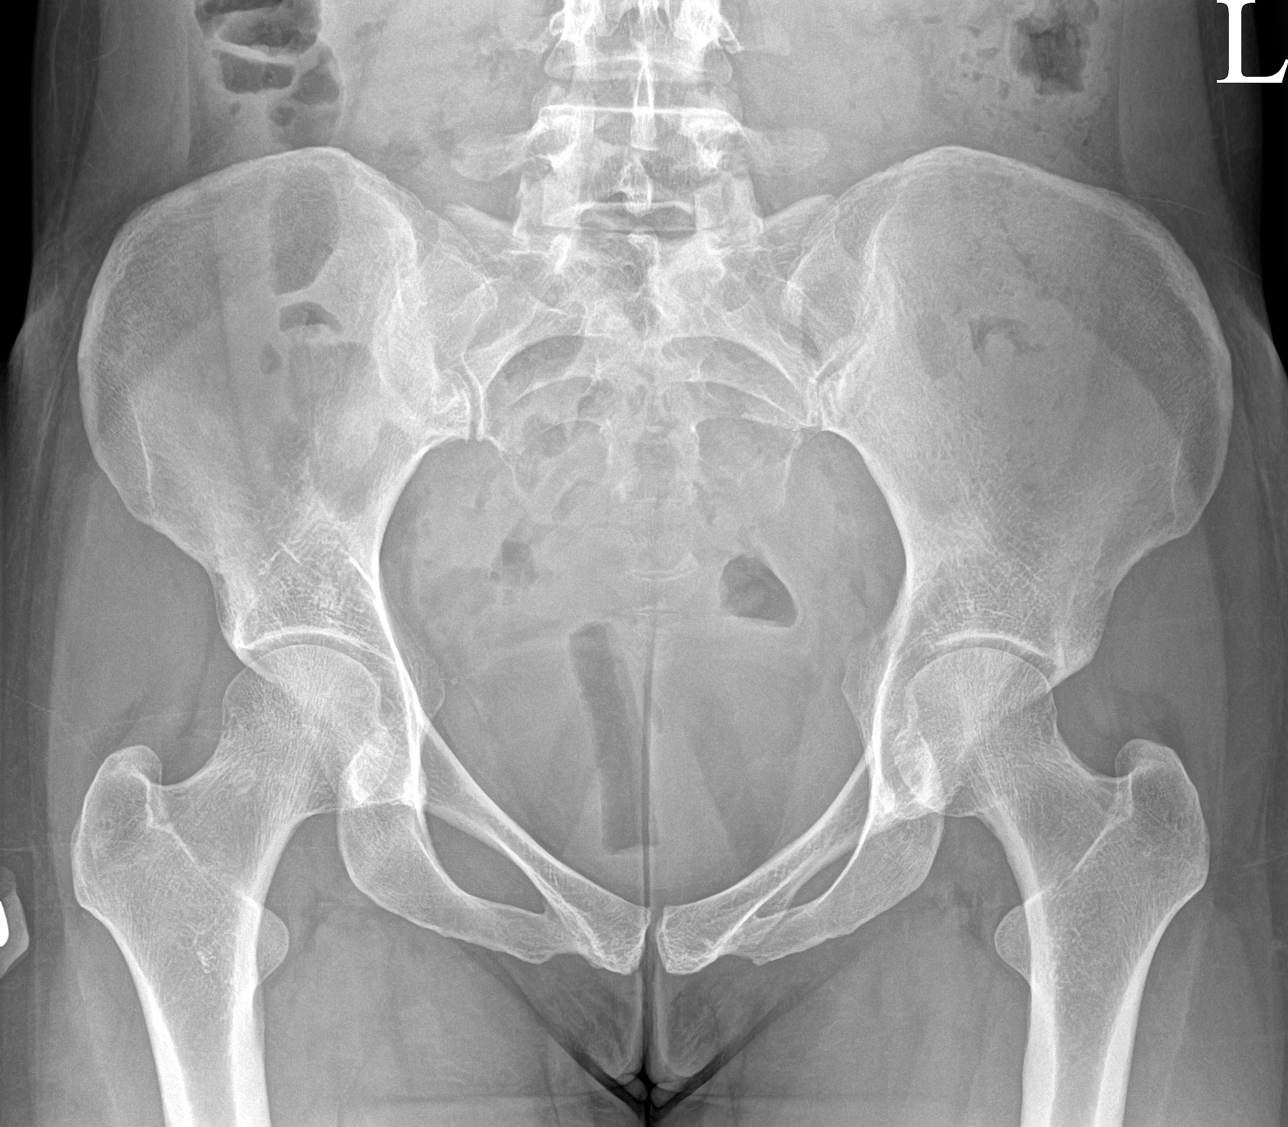

[hip ap]
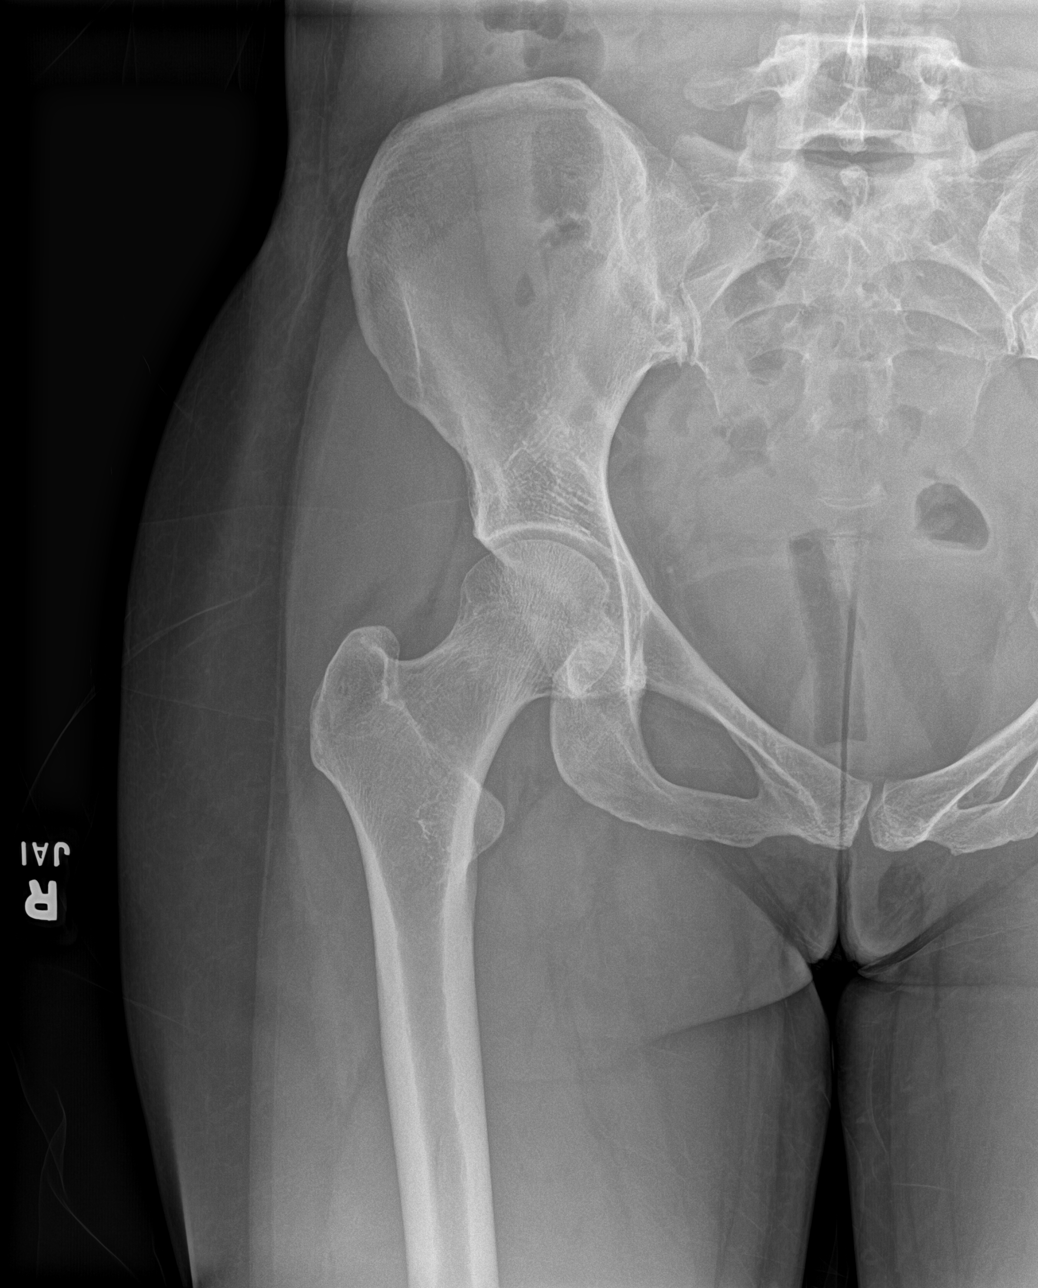

[hip frog leg]
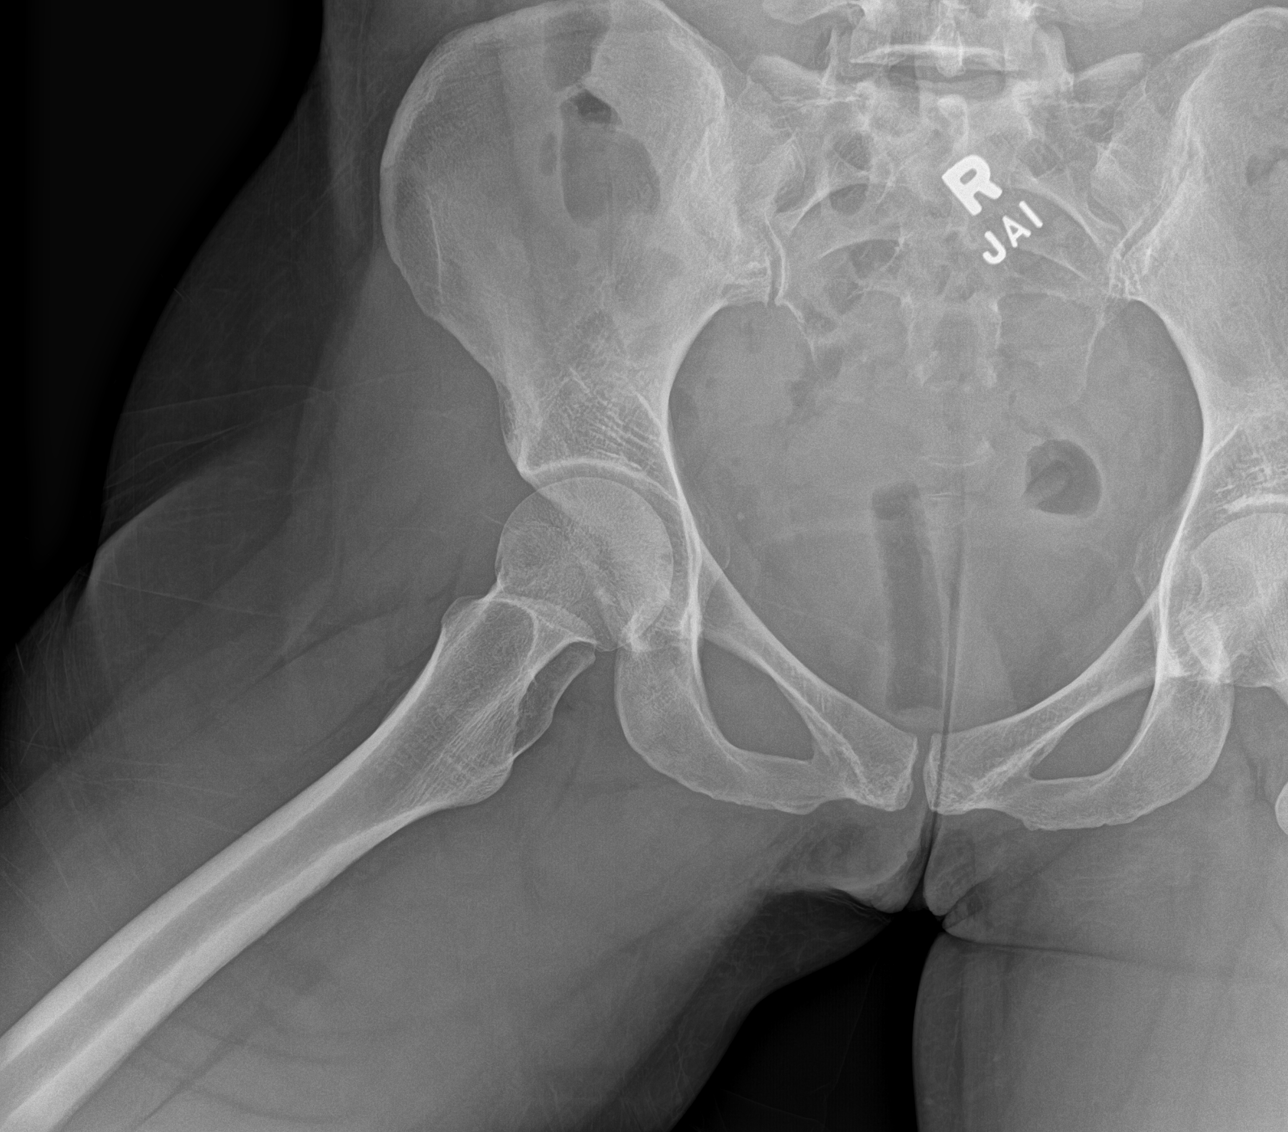

[3 of 3 positions shown; findings below may reference images not displayed]

FINDINGS: Frontal view of the pelvis as well as frontal and frogleg lateral
views of the right hip are obtained. Alignment is anatomic. No acute
displaced fractures. Joint spaces are well preserved. Sacroiliac
joints are normal. Soft tissues are unremarkable.
IMPRESSION: 1. Unremarkable pelvis and right hip.
